# Patient Record
Sex: Female | Born: 1990 | Race: White | Hispanic: No | Marital: Single | Smoking: Current every day smoker
Health system: Southern US, Community
[De-identification: ages and names within clinical notes are randomized; demographics above are authoritative.]

## PROBLEM LIST (undated history)

## (undated) DIAGNOSIS — I10 Essential (primary) hypertension: Secondary | ICD-10-CM

## (undated) DIAGNOSIS — E079 Disorder of thyroid, unspecified: Secondary | ICD-10-CM

## (undated) HISTORY — PX: TONSILLECTOMY: SUR1361

---

## 2009-05-16 ENCOUNTER — Emergency Department (HOSPITAL_COMMUNITY): Admission: EM | Admit: 2009-05-16 | Discharge: 2009-05-17 | Payer: Self-pay | Admitting: Emergency Medicine

## 2012-05-21 ENCOUNTER — Encounter (HOSPITAL_COMMUNITY): Payer: Self-pay

## 2012-05-21 ENCOUNTER — Inpatient Hospital Stay (HOSPITAL_COMMUNITY)
Admission: EM | Admit: 2012-05-21 | Discharge: 2012-05-26 | DRG: 645 | Disposition: A | Discharge status: Home / Self Care | Payer: MEDICAID | Attending: Internal Medicine | Admitting: Internal Medicine

## 2012-05-21 DIAGNOSIS — E0590 Thyrotoxicosis, unspecified without thyrotoxic crisis or storm: Secondary | ICD-10-CM | POA: Diagnosis present

## 2012-05-21 DIAGNOSIS — E860 Dehydration: Secondary | ICD-10-CM | POA: Diagnosis present

## 2012-05-21 DIAGNOSIS — D509 Iron deficiency anemia, unspecified: Secondary | ICD-10-CM | POA: Diagnosis present

## 2012-05-21 DIAGNOSIS — I951 Orthostatic hypotension: Secondary | ICD-10-CM | POA: Diagnosis present

## 2012-05-21 DIAGNOSIS — R7402 Elevation of levels of lactic acid dehydrogenase (LDH): Secondary | ICD-10-CM | POA: Diagnosis present

## 2012-05-21 DIAGNOSIS — D638 Anemia in other chronic diseases classified elsewhere: Secondary | ICD-10-CM | POA: Diagnosis present

## 2012-05-21 DIAGNOSIS — R7401 Elevation of levels of liver transaminase levels: Secondary | ICD-10-CM | POA: Diagnosis present

## 2012-05-21 DIAGNOSIS — E0591 Thyrotoxicosis, unspecified with thyrotoxic crisis or storm: Principal | ICD-10-CM | POA: Diagnosis present

## 2012-05-21 DIAGNOSIS — I10 Essential (primary) hypertension: Secondary | ICD-10-CM | POA: Diagnosis present

## 2012-05-21 DIAGNOSIS — R079 Chest pain, unspecified: Secondary | ICD-10-CM | POA: Diagnosis present

## 2012-05-21 DIAGNOSIS — Z9089 Acquired absence of other organs: Secondary | ICD-10-CM

## 2012-05-21 DIAGNOSIS — F411 Generalized anxiety disorder: Secondary | ICD-10-CM | POA: Diagnosis present

## 2012-05-21 DIAGNOSIS — E0500 Thyrotoxicosis with diffuse goiter without thyrotoxic crisis or storm: Secondary | ICD-10-CM | POA: Diagnosis present

## 2012-05-21 DIAGNOSIS — H0520 Unspecified exophthalmos: Secondary | ICD-10-CM

## 2012-05-21 DIAGNOSIS — R000 Tachycardia, unspecified: Secondary | ICD-10-CM | POA: Diagnosis present

## 2012-05-21 DIAGNOSIS — F17200 Nicotine dependence, unspecified, uncomplicated: Secondary | ICD-10-CM | POA: Diagnosis present

## 2012-05-21 HISTORY — DX: Disorder of thyroid, unspecified: E07.9

## 2012-05-21 HISTORY — DX: Essential (primary) hypertension: I10

## 2012-05-21 LAB — CBC WITH DIFFERENTIAL/PLATELET
Eosinophils Relative: 1 % (ref 0–5)
Lymphocytes Relative: 28 % (ref 12–46)
Lymphs Abs: 2.3 10*3/uL (ref 0.7–4.0)
MCV: 76.2 fL — ABNORMAL LOW (ref 78.0–100.0)
Neutrophils Relative %: 64 % (ref 43–77)
Platelets: 265 10*3/uL (ref 150–400)
RBC: 5.37 MIL/uL — ABNORMAL HIGH (ref 3.87–5.11)
WBC: 8.1 10*3/uL (ref 4.0–10.5)

## 2012-05-21 LAB — COMPREHENSIVE METABOLIC PANEL
ALT: 17 U/L (ref 0–35)
Alkaline Phosphatase: 320 U/L — ABNORMAL HIGH (ref 39–117)
CO2: 24 mEq/L (ref 19–32)
GFR calc Af Amer: >90 mL/min (ref >90)
GFR calc non Af Amer: >90 mL/min (ref >90)
Glucose, Bld: 138 mg/dL — ABNORMAL HIGH (ref 70–99)
Potassium: 3.9 mEq/L (ref 3.5–5.1)
Sodium: 138 mEq/L (ref 135–145)
Total Bilirubin: 0.2 mg/dL — ABNORMAL LOW (ref 0.3–1.2)

## 2012-05-21 LAB — URINE MICROSCOPIC-ADD ON

## 2012-05-21 LAB — URINALYSIS, ROUTINE W REFLEX MICROSCOPIC
Bilirubin Urine: NEGATIVE
Ketones, ur: NEGATIVE mg/dL
Nitrite: NEGATIVE
Urobilinogen, UA: 1 mg/dL (ref 0.0–1.0)

## 2012-05-21 LAB — POCT PREGNANCY, URINE: Preg Test, Ur: NEGATIVE

## 2012-05-21 LAB — RAPID URINE DRUG SCREEN, HOSP PERFORMED: Barbiturates: NOT DETECTED

## 2012-05-21 NOTE — ED Notes (Signed)
Pt reports [facility] sent her to ED for medication refill. Pt reports she ran out of her Hyperthyroid and High Blood Pressure medication 2 days ago. Pt looking around triage room, tremors noted, fidgeting w/bp and pulse ox cords

## 2012-05-22 ENCOUNTER — Emergency Department (HOSPITAL_COMMUNITY): Payer: Self-pay

## 2012-05-22 DIAGNOSIS — E0590 Thyrotoxicosis, unspecified without thyrotoxic crisis or storm: Secondary | ICD-10-CM

## 2012-05-22 DIAGNOSIS — R7989 Other specified abnormal findings of blood chemistry: Secondary | ICD-10-CM

## 2012-05-22 DIAGNOSIS — I951 Orthostatic hypotension: Secondary | ICD-10-CM | POA: Diagnosis present

## 2012-05-22 DIAGNOSIS — R079 Chest pain, unspecified: Secondary | ICD-10-CM

## 2012-05-22 DIAGNOSIS — H0520 Unspecified exophthalmos: Secondary | ICD-10-CM | POA: Diagnosis present

## 2012-05-22 DIAGNOSIS — D509 Iron deficiency anemia, unspecified: Secondary | ICD-10-CM | POA: Diagnosis present

## 2012-05-22 DIAGNOSIS — R000 Tachycardia, unspecified: Secondary | ICD-10-CM

## 2012-05-22 DIAGNOSIS — E860 Dehydration: Secondary | ICD-10-CM | POA: Diagnosis present

## 2012-05-22 LAB — GLUCOSE, CAPILLARY

## 2012-05-22 LAB — GAMMA GT: GGT: 37 U/L (ref 7–51)

## 2012-05-22 LAB — TROPONIN I: Troponin I: <0.3 ng/mL (ref <0.30)

## 2012-05-22 LAB — CBC
Hemoglobin: 12.2 g/dL (ref 12.0–15.0)
MCHC: 33.1 g/dL (ref 30.0–36.0)
RDW: 13.7 % (ref 11.5–15.5)

## 2012-05-22 LAB — BASIC METABOLIC PANEL
GFR calc Af Amer: >90 mL/min (ref >90)
GFR calc non Af Amer: >90 mL/min (ref >90)
Potassium: 3.6 mEq/L (ref 3.5–5.1)
Sodium: 139 mEq/L (ref 135–145)

## 2012-05-22 LAB — MRSA PCR SCREENING: MRSA by PCR: NEGATIVE

## 2012-05-22 LAB — HEPATIC FUNCTION PANEL: Total Protein: 6.5 g/dL (ref 6.0–8.3)

## 2012-05-22 MED ORDER — METHIMAZOLE 10 MG PO TABS
10.0000 mg | ORAL_TABLET | Freq: Every day | ORAL | Status: DC
  Filled 2012-05-22: qty 1

## 2012-05-22 MED ORDER — HEPARIN SODIUM (PORCINE) 5000 UNIT/ML IJ SOLN
5000.0000 [IU] | Freq: Three times a day (TID) | INTRAMUSCULAR | Status: DC
  Administered 2012-05-22 – 2012-05-26 (×13): 5000 [IU] via SUBCUTANEOUS
  Filled 2012-05-22 (×16): qty 1

## 2012-05-22 MED ORDER — ATENOLOL 25 MG PO TABS: 25.0000 mg | ORAL_TABLET | Freq: Every day | ORAL | Status: DC

## 2012-05-22 MED ORDER — DOCUSATE SODIUM 100 MG PO CAPS
100.0000 mg | ORAL_CAPSULE | Freq: Two times a day (BID) | ORAL | Status: DC
Start: 2012-05-22 — End: 2012-05-26
  Administered 2012-05-24 – 2012-05-25 (×3): 100 mg via ORAL
  Filled 2012-05-22 (×10): qty 1

## 2012-05-22 MED ORDER — METHIMAZOLE 10 MG PO TABS: 10.0000 mg | ORAL_TABLET | Freq: Every day | ORAL | Status: DC

## 2012-05-22 MED ORDER — SODIUM CHLORIDE 0.9 % IV BOLUS (SEPSIS)
1000.0000 mL | Freq: Once | INTRAVENOUS | Status: AC
  Administered 2012-05-22: 1000 mL via INTRAVENOUS

## 2012-05-22 MED ORDER — ONDANSETRON HCL 4 MG/2ML IJ SOLN: 4.0000 mg | Freq: Four times a day (QID) | INTRAMUSCULAR | Status: DC | PRN

## 2012-05-22 MED ORDER — ATENOLOL 50 MG PO TABS: 50.0000 mg | ORAL_TABLET | Freq: Every day | ORAL | Status: DC

## 2012-05-22 MED ORDER — SENNA 8.6 MG PO TABS
1.0000 | ORAL_TABLET | Freq: Two times a day (BID) | ORAL | Status: DC
  Administered 2012-05-24 – 2012-05-25 (×3): 8.6 mg via ORAL
  Filled 2012-05-22 (×10): qty 1

## 2012-05-22 MED ORDER — METOPROLOL TARTRATE 1 MG/ML IV SOLN
5.0000 mg | Freq: Four times a day (QID) | INTRAVENOUS | Status: DC
  Administered 2012-05-22 – 2012-05-23 (×3): 5 mg via INTRAVENOUS
  Filled 2012-05-22 (×6): qty 5

## 2012-05-22 MED ORDER — ATENOLOL 25 MG PO TABS
50.0000 mg | ORAL_TABLET | ORAL | Status: AC
  Administered 2012-05-22: 50 mg via ORAL
  Filled 2012-05-22: qty 2

## 2012-05-22 MED ORDER — ASPIRIN 81 MG PO CHEW: 162.0000 mg | CHEWABLE_TABLET | ORAL | Status: DC

## 2012-05-22 MED ORDER — ALUM & MAG HYDROXIDE-SIMETH 200-200-20 MG/5ML PO SUSP
30.0000 mL | Freq: Four times a day (QID) | ORAL | Status: DC | PRN
  Administered 2012-05-23: 30 mL via ORAL
  Filled 2012-05-22 (×2): qty 30

## 2012-05-22 MED ORDER — ACETAMINOPHEN 325 MG PO TABS
650.0000 mg | ORAL_TABLET | Freq: Once | ORAL | Status: AC
  Administered 2012-05-22: 650 mg via ORAL
  Filled 2012-05-22: qty 2

## 2012-05-22 MED ORDER — ONDANSETRON HCL 4 MG PO TABS: 4.0000 mg | ORAL_TABLET | Freq: Four times a day (QID) | ORAL | Status: DC | PRN

## 2012-05-22 MED ORDER — MECLIZINE HCL 12.5 MG PO TABS
12.5000 mg | ORAL_TABLET | Freq: Three times a day (TID) | ORAL | Status: DC | PRN
  Administered 2012-05-22 – 2012-05-23 (×2): 12.5 mg via ORAL
  Filled 2012-05-22 (×2): qty 1

## 2012-05-22 MED ORDER — HYDROCORTISONE SOD SUCCINATE 100 MG IJ SOLR
50.0000 mg | Freq: Three times a day (TID) | INTRAMUSCULAR | Status: DC
  Administered 2012-05-22 – 2012-05-23 (×4): 50 mg via INTRAVENOUS
  Filled 2012-05-22 (×7): qty 1

## 2012-05-22 MED ORDER — SODIUM CHLORIDE 0.9 % IV SOLN
INTRAVENOUS | Status: DC
  Administered 2012-05-22: 1000 mL via INTRAVENOUS
  Administered 2012-05-22: 09:00:00 via INTRAVENOUS

## 2012-05-22 MED ORDER — SODIUM CHLORIDE 0.9 % IJ SOLN: 3.0000 mL | Freq: Two times a day (BID) | INTRAMUSCULAR | Status: DC

## 2012-05-22 MED ORDER — METHIMAZOLE 10 MG PO TABS
30.0000 mg | ORAL_TABLET | Freq: Every day | ORAL | Status: DC
  Administered 2012-05-23: 30 mg via ORAL
  Filled 2012-05-22: qty 3

## 2012-05-22 MED ORDER — PREDNISONE 20 MG PO TABS
40.0000 mg | ORAL_TABLET | ORAL | Status: AC
  Administered 2012-05-22: 40 mg via ORAL
  Filled 2012-05-22: qty 2

## 2012-05-22 MED ORDER — METHIMAZOLE 10 MG PO TABS
20.0000 mg | ORAL_TABLET | ORAL | Status: AC
  Administered 2012-05-22: 20 mg via ORAL
  Filled 2012-05-22: qty 2

## 2012-05-22 NOTE — Progress Notes (Signed)
Report called to receiving unit 2603.  Past medical history and present hospital course reported.  Patient transferred via wheelchair with belongings with patient. All questions answered.  Patient made aware.

## 2012-05-22 NOTE — Progress Notes (Signed)
[facility] Progress Note [place] TEAM 1 - Stepdown/ICU TEAM   Vickie Beard GNF:[id number] DOB: 1990/12/27 DOA: 05/21/2012 PCP: Sheila Oats, MD  Brief narrative: 21 year old female previously diagnosed with hyperthyroidism/Graves' disease 4 years ago while living in [place]. Presented to the emergency department with request to refill her medications. The patient endorsed no insurance, no PCP and has been receiving medication refills for her Tapazole from local emergency departments. Has been hoarding her medications and staggering her dosages to make sure she does not run out. Last ER visit in [place] 6 months previous. Upon physical exam she was found to be tachycardic with heart rate in the 40s and a low-grade temperature of 100.3. She was quite restless anxious and flushed. The emergency department after 2 L of normal saline her tachycardia improved slightly to 110. Patient also reported chest pain for 2 days but no shortness of breath, nausea vomiting, spasms and twitching. She's not had any reported altered mentation or loss of consciousness. Because of risk for requesting to thyroid storm patient was admitted to the step down unit for further monitoring and evaluation.  Assessment/Plan:  Hyperthyroidism - inadequately treated *TSH quite low at 0.009 with a free T4 markedly elevated at 5.47 *Continue hydrocortisone IV  *Continue Tapazole daily but increase dose *Continue supportive care regarding other sequelae of hyperthyroidism *Will eventually need outpatient followup with an endocrinologist who can decide whether patient is appropriate for radioactive iodine treatment/ablation  Tachycardia *Continue beta blockade with atenolol *Unclear how long patient has been persistently tachycardic and may need echocardiogram to rule out tachycardia induced cardiomyopathy especially with suboptimal blood pressures *Currently at rest is not experiencing  tachycardia  Chest pain *Has resolved after adequate treatment of underlying sequela of hyperthyroidism primarily reducing heart rate improving hydration  Orthostasis/ Dehydration *Patient has significant decrease in supine to sitting blood pressures which did improve somewhat with the saline boluses *Likely related to poor cardiac output in the setting of tachycardia  Transaminitis *Likely sequela of poor perfusion related to dehydration and persistent tachycardia current trend is downward and total bilirubin is normal indicating a nonobstructive etiology  Exophthalmos *Very prominent and hopefully will improve if receives consistent treatment for hyperthyroidism. Unfortunately I suspect this has been occurring for greater than 4 years and may not be reversible  Microcytic anemia *Likely sequela of untreated hyperthyroidism and chronic disease *check an anemia panel  DVT prophylaxis: Heparin subcutaneous Code Status: Full Disposition Plan: Remain in step down  Consultants: None  Procedures: None  Antibiotics: None  HPI/Subjective: Patient alert and states feeling much better than prior to admission although still somewhat anxious and jittery. States she finally slept through the night which has not been able to do recently. Denies further chest pain.   Objective: Blood pressure 119/61, pulse 88, temperature 98.7 F (37.1 C), temperature source Oral, resp. rate 16, height 5\' 2"  (1.575 m), weight 72.2 kg (159 lb 2.8 oz), last menstrual period 05/11/2012, SpO2 97.00%.  Intake/Output Summary (Last 24 hours) at 05/22/12 1108 Last data filed at 05/22/12 1100  Gross per 24 hour  Intake   1860 ml  Output      0 ml  Net   1860 ml    Exam: Full clinical exam deferred since patient less than 24 hours since admission  Data Reviewed: Basic Metabolic Panel:  Lab 05/22/12 8469 05/21/12 2145  NA 139 138  K 3.6 3.9  CL 104 102  CO2 24 24  GLUCOSE 102* 138*  BUN 9 10  CREATININE 0.28* 0.33*  CALCIUM 10.5 10.5  MG -- --  PHOS -- --   Liver Function Tests:  Lab 05/22/12 0443 05/21/12 2145  AST 16 18  ALT 15 17  ALKPHOS 278* 320*  BILITOT 0.3 0.2*  PROT 6.5 7.5  ALBUMIN 3.0* 3.5   CBC:  Lab 05/22/12 0443 05/21/12 2145  WBC 7.0 8.1  NEUTROABS -- 5.1  HGB 12.2 14.1  HCT 36.9 40.9  MCV 76.4* 76.2*  PLT 235 265   Cardiac Enzymes:  Lab 05/22/12 0443  CKTOTAL --  CKMB --  CKMBINDEX --  TROPONINI <0.30   CBG:  Lab 05/22/12 0603  GLUCAP 94    Recent Results (from the past 240 hour(s))  MRSA PCR SCREENING     Status: Normal   Collection Time   05/22/12  6:03 AM      Component Value Range Status Comment   MRSA by PCR NEGATIVE  NEGATIVE Final      Studies:  Recent x-ray studies have been reviewed in detail by the Attending Physician  Scheduled Meds:  Reviewed in detail by the Attending Physician   Junious Silk, ANP [facility] Office  [phone] Pager [phone]  On-Call/Text Page:      [email or web address]      password [email or web address]  If 7PM-7AM, please contact night-coverage [email or web address] Password [email or web address] 05/22/2012, 11:08 AM   LOS: 1 day   I have personally examined this patient and reviewed the entire database. I have reviewed the above note, made any necessary editorial changes, and agree with its content.  Lonia Blood, MD [facility]

## 2012-05-22 NOTE — Care Management Note (Unsigned)
    Page 1 of 1   05/26/2012     11:15:46 AM   CARE MANAGEMENT NOTE 05/26/2012  Patient:  Vickie Beard, Vickie Beard   Account Number:  [id number]  Date Initiated:  05/22/2012  Documentation initiated by:  Avie Arenas  Subjective/Objective Assessment:   Tachycardic -     Action/Plan:   Anticipated DC Date:  05/25/2012   Anticipated DC Plan:  HOME/SELF CARE      DC Planning Services  CM consult  Medication Assistance      Choice offered to / List presented to:             Status of service:  In process, will continue to follow Medicare Important Message given?   (If response is "NO", the following Medicare IM given date fields will be blank) Date Medicare IM given:   Date Additional Medicare IM given:    Discharge Disposition:    Per UR Regulation:  Reviewed for med. necessity/level of care/duration of stay  If discussed at Long Length of Stay Meetings, dates discussed:    Comments:  ContactAlveda Reasons Mother [phone]  05/26/12  1113  Julieanna Geraci SIMMONS RN, BSN [phone] PT IS ELIGIBLE FOR [facility] FUND; MD:  PLEASE WRITE SEPARATE SCRIPTS FOR 3 DAYS SUPPLY OF MEDS AT DISCHARGE TO BE SENT TO MAIN PHARMACY. NCM WILL FOLLOW.   05-22-12 9:30am Avie Arenas, RNBSN [phone] Given list for PCP options in [place].  States on a waiting list at a physicians office already and will be able to get in the end of October.  Lives with Mom, states she will assist with medication that she needs on discharge.    Attempted to contact mother but, no answer. No message left.

## 2012-05-22 NOTE — ED Provider Notes (Signed)
History     CSN: [id number]  Arrival date & time 05/21/12  2034   First MD Initiated Contact with Patient 05/22/12 0003      Chief Complaint  Patient presents with  . Medication Refill    (Consider location/radiation/quality/duration/timing/severity/associated sxs/prior treatment) The history is provided by the patient.  sent here from health dept for med refills, PT denies any sig complaints other than she feels like her heart is beating faster than normal. No F/C - noted to have LGF in triage. No N/V/D, always has heat/ cold sensitivity with h/o thyroid disease - is unchanged. No SOB or CP. Denies any recent illness, no meds today, does have a few pills left in both pill bottles.   Past Medical History  Diagnosis Date  . Hypertension   . Thyroid disease     Past Surgical History  Procedure Date  . Tonsillectomy     History reviewed. No pertinent family history.  History  Substance Use Topics  . Smoking status: Current Everyday Smoker  . Smokeless tobacco: Not on file  . Alcohol Use: Yes     occasional     OB History    Grav Para Term Preterm Abortions TAB SAB Ect Mult Living                  Review of Systems  Constitutional: Negative for fever and chills.  HENT: Negative for neck pain and neck stiffness.   Eyes: Negative for pain.  Respiratory: Negative for shortness of breath.   Cardiovascular: Negative for chest pain and leg swelling.  Gastrointestinal: Negative for abdominal pain.  Genitourinary: Negative for dysuria.  Musculoskeletal: Negative for back pain.  Skin: Negative for rash.  Neurological: Negative for headaches.  All other systems reviewed and are negative.    Allergies  Review of patient's allergies indicates no known allergies.  Home Medications   Current Outpatient Rx  Name Route Sig Dispense Refill  . ATENOLOL 50 MG PO TABS Oral Take 25 mg by mouth daily.    Marland Kitchen METHIMAZOLE 5 MG PO TABS Oral Take 10 mg by mouth daily.      BP  132/60  Pulse 117  Temp 99 F (37.2 C) (Oral)  Resp 18  SpO2 99%  LMP 05/11/2012  Physical Exam  Constitutional: She is oriented to person, place, and time. She appears well-developed and well-nourished.  HENT:  Head: Normocephalic and atraumatic.  Eyes: EOM are normal. Pupils are equal, round, and reactive to light.  Neck: Trachea normal. Neck supple. No thyromegaly present.  Cardiovascular: Regular rhythm, S1 normal, S2 normal and normal pulses.     No systolic murmur is present   No diastolic murmur is present  Pulses:      Radial pulses are 2+ on the right side, and 2+ on the left side.       tachycardic  Pulmonary/Chest: Effort normal and breath sounds normal. She has no wheezes. She has no rhonchi. She has no rales. She exhibits no tenderness.  Abdominal: Soft. Normal appearance and bowel sounds are normal. There is no tenderness. There is no CVA tenderness and negative Murphy's sign.  Musculoskeletal: Normal range of motion. She exhibits no edema and no tenderness.       BLE:s Calves nontender, no cords or erythema, negative Homans sign  Neurological: She is alert and oriented to person, place, and time. She has normal strength. No cranial nerve deficit or sensory deficit. GCS eye subscore is 4. GCS verbal subscore is  5. GCS motor subscore is 6.       hyperreflexive  Skin: Skin is warm and dry. No rash noted. She is not diaphoretic.  Psychiatric: Her speech is normal.       Cooperative and appropriate    ED Course  Procedures (including critical care time)  Results for orders placed during the hospital encounter of 05/21/12  CBC WITH DIFFERENTIAL      Component Value Range   WBC 8.1  4.0 - 10.5 K/uL   RBC 5.37 (*) 3.87 - 5.11 MIL/uL   Hemoglobin 14.1  12.0 - 15.0 g/dL   HCT 16.1  09.6 - 04.5 %   MCV 76.2 (*) 78.0 - 100.0 fL   MCH 26.3  26.0 - 34.0 pg   MCHC 34.5  30.0 - 36.0 g/dL   RDW 40.9  81.1 - 91.4 %   Platelets 265  150 - 400 K/uL   Neutrophils Relative 64   43 - 77 %   Neutro Abs 5.1  1.7 - 7.7 K/uL   Lymphocytes Relative 28  12 - 46 %   Lymphs Abs 2.3  0.7 - 4.0 K/uL   Monocytes Relative 8  3 - 12 %   Monocytes Absolute 0.6  0.1 - 1.0 K/uL   Eosinophils Relative 1  0 - 5 %   Eosinophils Absolute 0.0  0.0 - 0.7 K/uL   Basophils Relative 0  0 - 1 %   Basophils Absolute 0.0  0.0 - 0.1 K/uL  COMPREHENSIVE METABOLIC PANEL      Component Value Range   Sodium 138  135 - 145 mEq/L   Potassium 3.9  3.5 - 5.1 mEq/L   Chloride 102  96 - 112 mEq/L   CO2 24  19 - 32 mEq/L   Glucose, Bld 138 (*) 70 - 99 mg/dL   BUN 10  6 - 23 mg/dL   Creatinine, Ser 7.82 (*) 0.50 - 1.10 mg/dL   Calcium 95.6  8.4 - 21.3 mg/dL   Total Protein 7.5  6.0 - 8.3 g/dL   Albumin 3.5  3.5 - 5.2 g/dL   AST 18  0 - 37 U/L   ALT 17  0 - 35 U/L   Alkaline Phosphatase 320 (*) 39 - 117 U/L   Total Bilirubin 0.2 (*) 0.3 - 1.2 mg/dL   GFR calc non Af Amer >90  >90 mL/min   GFR calc Af Amer >90  >90 mL/min  URINALYSIS, ROUTINE W REFLEX MICROSCOPIC      Component Value Range   Color, Urine YELLOW  YELLOW   APPearance CLEAR  CLEAR   Specific Gravity, Urine 1.020  1.005 - 1.030   pH 5.0  5.0 - 8.0   Glucose, UA NEGATIVE  NEGATIVE mg/dL   Hgb urine dipstick TRACE (*) NEGATIVE   Bilirubin Urine NEGATIVE  NEGATIVE   Ketones, ur NEGATIVE  NEGATIVE mg/dL   Protein, ur NEGATIVE  NEGATIVE mg/dL   Urobilinogen, UA 1.0  0.0 - 1.0 mg/dL   Nitrite NEGATIVE  NEGATIVE   Leukocytes, UA NEGATIVE  NEGATIVE  URINE RAPID DRUG SCREEN (HOSP PERFORMED)      Component Value Range   Opiates NONE DETECTED  NONE DETECTED   Cocaine NONE DETECTED  NONE DETECTED   Benzodiazepines NONE DETECTED  NONE DETECTED   Amphetamines NONE DETECTED  NONE DETECTED   Tetrahydrocannabinol NONE DETECTED  NONE DETECTED   Barbiturates NONE DETECTED  NONE DETECTED  POCT PREGNANCY, URINE  Component Value Range   Preg Test, Ur NEGATIVE  NEGATIVE  URINE MICROSCOPIC-ADD ON      Component Value Range   Squamous  Epithelial / LPF FEW (*) RARE   RBC / HPF 0-2  <3 RBC/hpf   Bacteria, UA RARE  RARE  CBC      Component Value Range   WBC 7.0  4.0 - 10.5 K/uL   RBC 4.83  3.87 - 5.11 MIL/uL   Hemoglobin 12.2  12.0 - 15.0 g/dL   HCT 16.1  09.6 - 04.5 %   MCV 76.4 (*) 78.0 - 100.0 fL   MCH 25.3 (*) 26.0 - 34.0 pg   MCHC 33.1  30.0 - 36.0 g/dL   RDW 40.9  81.1 - 91.4 %   Platelets 235  150 - 400 K/uL   Dg Chest 2 View  05/22/2012  *RADIOLOGY REPORT*  Clinical Data: Chest pain, hypertension, tachycardia  CHEST - 2 VIEW  Comparison: None  Findings: Upper-normal size of cardiac silhouette. Mediastinal contours and pulmonary vascularity normal. Lungs clear. Minimal peribronchial thickening. No pleural effusion or pneumothorax. Bones unremarkable.  IMPRESSION: Minimal bronchitic changes.   Original Report Authenticated By: Lollie Marrow, M.D.        Dg Chest 2 View  05/22/2012  *RADIOLOGY REPORT*  Clinical Data: Chest pain, hypertension, tachycardia  CHEST - 2 VIEW  Comparison: None  Findings: Upper-normal size of cardiac silhouette. Mediastinal contours and pulmonary vascularity normal. Lungs clear. Minimal peribronchial thickening. No pleural effusion or pneumothorax. Bones unremarkable.  IMPRESSION: Minimal bronchitic changes.   Original Report Authenticated By: Lollie Marrow, M.D.     Date: 05/22/2012  Rate: 141  Rhythm: sinus tachycardia  QRS Axis: normal  Intervals: normal  ST/T Wave abnormalities: nonspecific ST changes  Conduction Disutrbances:none  Narrative Interpretation:   Old EKG Reviewed: none available  IVFs.  Home meds provided  2 L IVfs, remioans tachycardic. LGF no obvious infectious source. TSH pending.   MED c/s 4:00 AM d/w [facility] who agrees to evaluate in the ED. Plan MED admit.   MDM   VS and nursing notes reviewed. Labs, ECG, IVFs, medications provided, MED admit.         Sunnie Nielsen, MD 05/22/12 [phone]

## 2012-05-22 NOTE — Progress Notes (Signed)
Clinical Social Work Department BRIEF PSYCHOSOCIAL ASSESSMENT 05/22/2012  Patient:  Vickie Beard, Vickie Beard     Account Number:  [id number]     Admit date:  05/21/2012  Clinical Social Worker:  Margaree Mackintosh  Date/Time:  05/22/2012 11:53 AM  Referred by:  Physician  Date Referred:  05/22/2012 Referred for  Other - See comment   Other Referral:   Financial Assistance.   Interview type:  Patient Other interview type:    PSYCHOSOCIAL DATA Living Status:  FAMILY Admitted from facility:   Level of care:   Primary support name:   Primary support relationship to patient:   Degree of support available:   Pt reports she resides with her mother.    CURRENT CONCERNS Current Concerns  Financial Resources   Other Concerns:    SOCIAL WORK ASSESSMENT / PLAN Clinical Social Worker recieved referral inquirng if pt would qualify for Medicaid to assist with medical needs. CSW reviewed chart and met with pt.  CSW introduced self, explained role, and provided support.  Pt stated that she resides with her mother and moved to [place] from [place] 6 months prior.  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  At this time, pt reports she does not have any bank accounts or assets but does Producer, television/film/video.  CSW made referral to [employer].  No other CSW needs identified at this time, CSW to sign off.  Please re consult if needed.   Assessment/plan status:  No Further Intervention Required Other assessment/ plan:   Information/referral to community resources:   Field seismologist.    PATIENT'S/FAMILY'S RESPONSE TO PLAN OF CARE: Pt answered questions when asked, but did not provide additional details.  Pt thanked CSW for intervention.     Angelia Mould, MSW, [place] [phone]

## 2012-05-22 NOTE — ED Notes (Signed)
Pt has been taking Methimazole 5mg  and Atenolol 50mg 

## 2012-05-22 NOTE — H&P (Signed)
[facility]                                                                                    Patient Demographics  Vickie Beard, is a 21 y.o. female  CSN: [id number]  MRN: [id number]  DOB - Mar 08, 1991  Admit Date - 05/21/2012  Outpatient Primary MD for the patient is DEFAULT,PROVIDER, MD   With History of -  Past Medical History  Diagnosis Date  . Hypertension   . Thyroid disease       Past Surgical History  Procedure Date  . Tonsillectomy     in for   Chief Complaint  Patient presents with  . Medication Refill     HPI  Vickie Beard  is a 21 y.o. female, with significant past medical history of hyperthyroidism secondary to Graves' disease, for the last 4 years, patient reports she does not have insurance, as well she does not have PCP,  where she has been getting her medication refills from ED, reports last time she's been in ED department around [place] area before 6 months, patient presents today to refill her medication, as she reports she only has 2 tablets left of her methimazole and atenolol, but reports as she wanted to spare her tablets in case she can get refill she did not take any medications on Thursday last meds she took her on Wednesday, and she presents for medication refill, patient was found to be tachycardic with heart rate in the 140s, with low-grade temperature 100.3, appears to be extremely anxious and flushed, patient was getting 2 L of normal saline, where she still tachycardic in 110's, upon questioning the patient she reports she has been feeling chest comfort for the last 2 days as well, denies any shortness of breath, syncope, confusion, altered mental status, nausea, vomiting, for spasms or twitching.    Review of Systems    In addition to the HPI above,  Had low-grade fever but no chills No Headache, No changes with Vision or hearing, No problems swallowing food or Liquids, Complaints of chest discomfort but denies  Cough or Shortness of Breath, No Abdominal pain, No Nausea or Vommitting, Bowel movements are regular, No Blood in stool or Urine, No dysuria, No new skin rashes or bruises, No new joints pains-aches,  No new weakness, tingling, numbness in any extremity, No recent weight gain or loss, No polyuria, polydypsia or polyphagia, No significant Mental Stressors.  A full 10 point Review of Systems was done, except as stated above, all other Review of Systems were negative.   Social History History  Substance Use Topics  . Smoking status: Current Everyday Smoker  . Smokeless tobacco: Not on file  . Alcohol Use: Yes     occasional     Family History History reviewed. No pertinent family history.   Prior to Admission medications   Medication Sig Start Date End Date Taking? Authorizing Provider  atenolol (TENORMIN) 50 MG tablet Take 25 mg by mouth daily.   Yes Historical Provider, MD  methimazole (TAPAZOLE) 5 MG tablet Take 10 mg by mouth daily.   Yes Historical Provider, MD    No Known Allergies  Physical Exam  Vitals  Blood pressure 101/48, pulse 119, temperature 98.5 F (36.9 C), temperature source Oral, resp. rate 16, last menstrual period 05/11/2012, SpO2 99.00%.   1. General Young anxious female lying in bed in NAD,   2. anxious, Not Suicidal or Homicidal, Awake Alert, Oriented X 3.  3. No F.N deficits, ALL C.Nerves Intact, Strength 5/5 all 4 extremities, Sensation intact all 4 extremities, Plantars down going.  4. has exophthalmos ,Ears  appear Normal, injected Conjunctivae , PERRLA. Moist Oral Mucosa.  5. Supple Neck, No JVD, No cervical lymphadenopathy appriciated, No Carotid Bruits.  6. Symmetrical Chest wall movement, Good air movement bilaterally, CTAB.  7. regular rhythm but tachycardic, No Gallops, Rubs or Murmurs, No Parasternal Heave.  8. Positive Bowel Sounds, Abdomen Soft, Non tender, No organomegaly appriciated,No rebound -guarding or rigidity.  9.   No Cyanosis, Normal Skin Turgor, No Skin Rash or Bruise.  10. Good muscle tone,  joints appear normal , no effusions, Normal ROM. No hyperreflexia  11. No Palpable Lymph Nodes in Neck or Axillae    Data Review  CBC  Lab 05/21/12 2145  WBC 8.1  HGB 14.1  HCT 40.9  PLT 265  MCV 76.2*  MCH 26.3  MCHC 34.5  RDW 13.8  LYMPHSABS 2.3  MONOABS 0.6  EOSABS 0.0  BASOSABS 0.0  BANDABS --   ------------------------------------------------------------------------------------------------------------------  Chemistries   Lab 05/21/12 2145  NA 138  K 3.9  CL 102  CO2 24  GLUCOSE 138*  BUN 10  CREATININE 0.33*  CALCIUM 10.5  MG --  AST 18  ALT 17  ALKPHOS 320*  BILITOT 0.2*   ------------------------------------------------------------------------------------------------------------------ CrCl is unknown because there is no height on file for the current visit. ------------------------------------------------------------------------------------------------------------------ No results found for this basename: TSH,T4TOTAL,FREET3,T3FREE,THYROIDAB in the last 72 hours   Coagulation profile No results found for this basename: INR:5,PROTIME:5 in the last 168 hours ------------------------------------------------------------------------------------------------------------------- No results found for this basename: DDIMER:2 in the last 72 hours -------------------------------------------------------------------------------------------------------------------  Cardiac Enzymes No results found for this basename: CK:3,CKMB:3,TROPONINI:3,MYOGLOBIN:3 in the last 168 hours ------------------------------------------------------------------------------------------------------------------ No components found with this basename: POCBNP:3   ---------------------------------------------------------------------------------------------------------------  Urinalysis    Component Value  Date/Time   COLORURINE YELLOW 05/21/2012 2243   APPEARANCEUR CLEAR 05/21/2012 2243   LABSPEC 1.020 05/21/2012 2243   PHURINE 5.0 05/21/2012 2243   GLUCOSEU NEGATIVE 05/21/2012 2243   HGBUR TRACE* 05/21/2012 2243   BILIRUBINUR NEGATIVE 05/21/2012 2243   KETONESUR NEGATIVE 05/21/2012 2243   PROTEINUR NEGATIVE 05/21/2012 2243   UROBILINOGEN 1.0 05/21/2012 2243   NITRITE NEGATIVE 05/21/2012 2243   LEUKOCYTESUR NEGATIVE 05/21/2012 2243    ----------------------------------------------------------------------------------------------------------------   Imaging results:   Dg Chest 2 View  05/22/2012  *RADIOLOGY REPORT*  Clinical Data: Chest pain, hypertension, tachycardia  CHEST - 2 VIEW  Comparison: None  Findings: Upper-normal size of cardiac silhouette. Mediastinal contours and pulmonary vascularity normal. Lungs clear. Minimal peribronchial thickening. No pleural effusion or pneumothorax. Bones unremarkable.  IMPRESSION: Minimal bronchitic changes.   Original Report Authenticated By: Lollie Marrow, M.D.     My personal review of EKG: Rhythm NSR, Rate  140, no ST or T wave  Assessment & Plan  Principal Problem:  *Hyperthyroidism Active Problems:  Tachycardia  Chest pain  Elevated LFTs    1. Hypothyroidism,/thyrotoxicosis: patient has Graves' disease for 4 years, has poor followup, reports and have had lap checked for one time, get her medication refill from ED departments, didn't take her medication for one day, will check  a TSH, free T4, will give her 20 mg of methimazole 1 dose now, 40 mg of by mouth prednisone one dose now, will give another dose of atenolol 50 mg oral now, we'll continue with IV fluid gestation, and we'll admit patient to step down,, and we'll have morning team consult endocrinology service in a.m.  2. Sinus tachycardia: This is related to her hyperthyroidism, as well do to stopping atenolol probably, will continue with her eustachian, patient will be bolused another 1 L of normal  saline on top of the previous 2 L she received in ED they will continue her 100 cc per hour, will resume her atenolol at a higher dose, will increase meds atenolol from 25-50 mg oral daily, will give her one dose statin.  3. Chest discomfort: This is most likely related to her hyperthyroidism, will check troponins, will be given 162 of aspirin in ED, will resume atenolol.  4. Elevated alkaline phosphatase, unclear what is the patient's baseline, for the time being will continue with methimazole as patient appears to be in a thyrotoxicosis, will obtain GGT level to clarify if her alkaline phosphatase elevation elevation is related to any liver problem.  Will discuss social worker services, to assist patient with her medication and followup as an outpatient. DVT Prophylaxis Heparin   AM Labs Ordered, also please review Full Orders  Family Communication: Admission, patients condition and plan of care including tests being ordered have been discussed with the patient and mother who indicate understanding and agree with the plan and Code Status.  Code Status full code  Disposition Plan: home  Time spent in minutes : 50 min  Condition GUARDED  Randol Kern, Sofija Antwi M.D on 05/22/2012 at 4:58 AM  urs  [facility] Office  [phone]

## 2012-05-22 NOTE — ED Notes (Signed)
Pt st's she was sent here to get a refill on her HTN meds until she could get into a clinic

## 2012-05-22 NOTE — Progress Notes (Signed)
UR Completed.  [phone] Vickie Beard 05/22/2012

## 2012-05-22 NOTE — Progress Notes (Signed)
[id number] Dr Sharon Seller made aware of Sinus brady 40.  Orders remain for SDU.

## 2012-05-23 DIAGNOSIS — H0520 Unspecified exophthalmos: Secondary | ICD-10-CM

## 2012-05-23 DIAGNOSIS — E0591 Thyrotoxicosis, unspecified with thyrotoxic crisis or storm: Secondary | ICD-10-CM | POA: Diagnosis present

## 2012-05-23 LAB — FOLATE: Folate: >20 ng/mL

## 2012-05-23 LAB — IRON AND TIBC
Iron: 80 ug/dL (ref 42–135)
Saturation Ratios: 28 % (ref 20–55)
TIBC: 287 ug/dL (ref 250–470)

## 2012-05-23 LAB — VITAMIN B12: Vitamin B-12: 597 pg/mL (ref 211–911)

## 2012-05-23 LAB — RETICULOCYTES: Retic Ct Pct: 1.6 % (ref 0.4–3.1)

## 2012-05-23 MED ORDER — METHIMAZOLE 5 MG PO TABS
15.0000 mg | ORAL_TABLET | Freq: Two times a day (BID) | ORAL | Status: DC
  Administered 2012-05-23 – 2012-05-26 (×7): 15 mg via ORAL
  Filled 2012-05-23 (×8): qty 1

## 2012-05-23 MED ORDER — PROPRANOLOL HCL 60 MG PO TABS
60.0000 mg | ORAL_TABLET | Freq: Four times a day (QID) | ORAL | Status: DC
  Administered 2012-05-23 – 2012-05-24 (×4): 60 mg via ORAL
  Filled 2012-05-23 (×7): qty 1

## 2012-05-23 MED ORDER — IODINE STRONG (LUGOLS) 5 % PO SOLN
0.2000 mL | Freq: Three times a day (TID) | ORAL | Status: DC
  Administered 2012-05-23 – 2012-05-26 (×8): 0.2 mL via ORAL
  Filled 2012-05-23 (×16): qty 0.2

## 2012-05-23 MED ORDER — HYDROCORTISONE SOD SUCCINATE 100 MG IJ SOLR
100.0000 mg | Freq: Three times a day (TID) | INTRAMUSCULAR | Status: AC
  Administered 2012-05-23 – 2012-05-25 (×7): 100 mg via INTRAVENOUS
  Filled 2012-05-23 (×9): qty 2

## 2012-05-23 MED ORDER — IODINE STRONG (LUGOLS) 5 % PO SOLN
0.2000 mL | Freq: Three times a day (TID) | ORAL | Status: DC
  Filled 2012-05-23 (×2): qty 0.2

## 2012-05-23 NOTE — Progress Notes (Signed)
[facility] Progress Note [place] TEAM 1 - Stepdown/ICU TEAM   Vickie Beard AVW:[id number] DOB: 12-Apr-1991 DOA: 05/21/2012 PCP: Sheila Oats, MD  Brief narrative: 21 year old female previously diagnosed with hyperthyroidism/Graves' disease 4 years ago while living in [place]. Presented to the emergency department with request to refill her medications. The patient endorsed no insurance, no PCP and has been receiving medication refills for her Tapazole from local emergency departments. Has been hoarding her medications and staggering her dosages to make sure she does not run out. Last ER visit in [place] 6 months previous. Upon physical exam she was found to be tachycardic with heart rate in the 40s and a low-grade temperature of 100.3. She was quite restless anxious and flushed. The emergency department after 2 L of normal saline her tachycardia improved slightly to 110. Patient also reported chest pain for 2 days but no shortness of breath, nausea vomiting, spasms and twitching. She's not had any reported altered mentation or loss of consciousness. Because of risk for requesting to thyroid storm patient was admitted to the step down unit for further monitoring and evaluation.  Assessment/Plan:  Hyperthyroidism - inadequately treated *TSH quite low at 0.009 with a free T4 markedly elevated at 5.47 *Continue hydrocortisone IV  *Continue Tapazole but increase dose *Given persistent tachycardia will change to propranolol and add Lugol's solution as patient is on the borderline diagnostically in qualifying for thyroid store *Will need outpatient followup with an endocrinologist who can decide whether patient is appropriate for radioactive iodine treatment/ablation  Tachycardia *Continue beta blockade with the changes noted above *Unclear how long patient has been persistently tachycardic and may need echocardiogram to rule out tachycardia induced cardiomyopathy  especially with suboptimal blood pressures  Chest pain *Has resolved after adequate treatment of underlying sequela of hyperthyroidism primarily reducing heart rate  Orthostasis/ Dehydration *Patient has significant decrease in supine to sitting blood pressures which did improve somewhat with the saline boluses *Likely related to poor cardiac output in the setting of tachycardia  Exophthalmos *Very prominent and hopefully will improve if receives consistent treatment for hyperthyroidism. Unfortunately I suspect this has been occurring for greater than 4 years and may not be reversible  DVT prophylaxis: Heparin subcutaneous Code Status: Full Disposition Plan: Remain in step down  Consultants: None  Procedures: None  Antibiotics: None  HPI/Subjective: The patient reports that she feels much better today.  She denies chest pain shortness of breath fevers chills nausea or vomiting.  Objective: Blood pressure 111/54, pulse 97, temperature 97.7 F (36.5 C), temperature source Axillary, resp. rate 16, height 5\' 2"  (1.575 m), weight 71.1 kg (156 lb 12 oz), last menstrual period 05/11/2012, SpO2 97.00%.  Intake/Output Summary (Last 24 hours) at 05/23/12 1104 Last data filed at 05/23/12 1000  Gross per 24 hour  Intake   1660 ml  Output      0 ml  Net   1660 ml    Exam: General: No acute respiratory distress Lungs: Clear to auscultation bilaterally without wheezes or crackles Cardiovascular: Tachycardic but regular without murmur gallop or rub normal S1 and S2 Abdomen: Nontender, nondistended, soft, bowel sounds positive, no rebound, no ascites, no appreciable mass Extremities: No significant cyanosis, clubbing; trace pretibial edema bilateral lower extremities  Data Reviewed: Basic Metabolic Panel:  Lab 05/22/12 8295 05/21/12 2145  NA 139 138  K 3.6 3.9  CL 104 102  CO2 24 24  GLUCOSE 102* 138*  BUN 9 10  CREATININE 0.28* 0.33*  CALCIUM 10.5 10.5  MG -- --  PHOS --  --   Liver Function Tests:  Lab 05/22/12 0443 05/21/12 2145  AST 16 18  ALT 15 17  ALKPHOS 278* 320*  BILITOT 0.3 0.2*  PROT 6.5 7.5  ALBUMIN 3.0* 3.5   CBC:  Lab 05/22/12 0443 05/21/12 2145  WBC 7.0 8.1  NEUTROABS -- 5.1  HGB 12.2 14.1  HCT 36.9 40.9  MCV 76.4* 76.2*  PLT 235 265   Cardiac Enzymes:  Lab 05/22/12 1824 05/22/12 1427 05/22/12 0443  CKTOTAL -- -- --  CKMB -- -- --  CKMBINDEX -- -- --  TROPONINI <0.30 <0.30 <0.30   CBG:  Lab 05/22/12 0603  GLUCAP 94    Recent Results (from the past 240 hour(s))  MRSA PCR SCREENING     Status: Normal   Collection Time   05/22/12  6:03 AM      Component Value Range Status Comment   MRSA by PCR NEGATIVE  NEGATIVE Final      Studies:  Recent x-ray studies have been reviewed in detail by the Attending Physician  Scheduled Meds:  Reviewed in detail by the Attending Physician   05/23/2012, 11:04 AM   LOS: 2 days   Lonia Blood, MD [facility] Office  [phone] Pager [phone]  On-Call/Text Page:      [email or web address]      password [email or web address]

## 2012-05-24 MED ORDER — PROPRANOLOL HCL 80 MG PO TABS
80.0000 mg | ORAL_TABLET | Freq: Four times a day (QID) | ORAL | Status: DC
  Administered 2012-05-24 – 2012-05-26 (×9): 80 mg via ORAL
  Filled 2012-05-24 (×12): qty 1

## 2012-05-24 NOTE — Progress Notes (Signed)
[facility] Progress Note [place] TEAM 1 - Stepdown/ICU TEAM   Vickie Beard ZOX:[id number] DOB: 03-16-1991 DOA: 05/21/2012 PCP: Sheila Oats, MD  Brief narrative: 21 year old female previously diagnosed with hyperthyroidism/Graves' disease 4 years ago while living in [place]. Presented to the emergency department with request to refill her medications. The patient endorsed no insurance, no PCP and has been receiving medication refills for her Tapazole from local emergency departments. Has been hoarding her medications and staggering her dosages to make sure she does not run out. Last ER visit in [place] 6 months previous. Upon physical exam she was found to be tachycardic with heart rate in the 40s and a low-grade temperature of 100.3. She was quite restless anxious and flushed. The emergency department after 2 L of normal saline her tachycardia improved slightly to 110. Patient also reported chest pain for 2 days but no shortness of breath, nausea vomiting, spasms and twitching. She's not had any reported altered mentation or loss of consciousness. Because of risk for requesting to thyroid storm patient was admitted to the step down unit for further monitoring and evaluation.  Assessment/Plan:  Hyperthyroidism - inadequately treated *TSH quite low at 0.009 with a free T4 markedly elevated at 5.47 *Continue hydrocortisone IV  *Continue Tapazole at increased dose *titrate propranolol upward - cont Lugols *Will need outpatient followup with an endocrinologist who can decide whether patient is appropriate for radioactive iodine treatment/ablation  Tachycardia *Continue beta blockade with the changes noted above  Chest pain *Has resolved after adequate treatment of underlying sequela of hyperthyroidism primarily reducing heart rate  Orthostasis/ Dehydration *Patient has significant decrease in supine to sitting blood pressures which did improve somewhat with the  saline boluses *Likely related to poor cardiac output in the setting of tachycardia  Exophthalmos *Very prominent and hopefully will improve if receives consistent treatment for hyperthyroidism. Unfortunately I suspect this has been occurring for greater than 4 years and may not be reversible  DVT prophylaxis: Heparin subcutaneous Code Status: Full Disposition Plan: Remain in step down  Consultants: None  Procedures: None  Antibiotics: None  HPI/Subjective: The patient reports that she again feels much better today.  She denies chest pain shortness of breath fevers chills nausea or vomiting.  Objective: Blood pressure 131/57, pulse 114, temperature 98.1 F (36.7 C), temperature source Oral, resp. rate 20, height 5\' 2"  (1.575 m), weight 72 kg (158 lb 11.7 oz), last menstrual period 05/11/2012, SpO2 98.00%.  Intake/Output Summary (Last 24 hours) at 05/24/12 1112 Last data filed at 05/24/12 0900  Gross per 24 hour  Intake   2052 ml  Output      0 ml  Net   2052 ml    Exam: General: No acute respiratory distress Lungs: Clear to auscultation bilaterally without wheezes or crackles Cardiovascular: Tachycardic but regular without murmur gallop or rub normal S1 and S2 Abdomen: Nontender, nondistended, soft, bowel sounds positive, no rebound, no ascites, no appreciable mass Extremities: No significant cyanosis, clubbing; trace pretibial edema bilateral lower extremities  Data Reviewed: Basic Metabolic Panel:  Lab 05/22/12 8119 05/21/12 2145  NA 139 138  K 3.6 3.9  CL 104 102  CO2 24 24  GLUCOSE 102* 138*  BUN 9 10  CREATININE 0.28* 0.33*  CALCIUM 10.5 10.5  MG -- --  PHOS -- --   Liver Function Tests:  Lab 05/22/12 0443 05/21/12 2145  AST 16 18  ALT 15 17  ALKPHOS 278* 320*  BILITOT 0.3 0.2*  PROT 6.5 7.5  ALBUMIN 3.0* 3.5   CBC:  Lab 05/22/12 0443 05/21/12 2145  WBC 7.0 8.1  NEUTROABS -- 5.1  HGB 12.2 14.1  HCT 36.9 40.9  MCV 76.4* 76.2*  PLT 235 265     Cardiac Enzymes:  Lab 05/22/12 1824 05/22/12 1427 05/22/12 0443  CKTOTAL -- -- --  CKMB -- -- --  CKMBINDEX -- -- --  TROPONINI <0.30 <0.30 <0.30   CBG:  Lab 05/22/12 0603  GLUCAP 94    Recent Results (from the past 240 hour(s))  MRSA PCR SCREENING     Status: Normal   Collection Time   05/22/12  6:03 AM      Component Value Range Status Comment   MRSA by PCR NEGATIVE  NEGATIVE Final      Studies:  Recent x-ray studies have been reviewed in detail by the Attending Physician  Scheduled Meds:  Reviewed in detail by the Attending Physician   05/24/2012, 11:12 AM   LOS: 3 days   Lonia Blood, MD [facility] Office  [phone] Pager [phone]  On-Call/Text Page:      [email or web address]      password [email or web address]

## 2012-05-25 LAB — COMPREHENSIVE METABOLIC PANEL
ALT: 15 U/L (ref 0–35)
Alkaline Phosphatase: 258 U/L — ABNORMAL HIGH (ref 39–117)
BUN: 8 mg/dL (ref 6–23)
CO2: 27 mEq/L (ref 19–32)
Chloride: 105 mEq/L (ref 96–112)
GFR calc Af Amer: >90 mL/min (ref >90)
GFR calc non Af Amer: >90 mL/min (ref >90)
Glucose, Bld: 109 mg/dL — ABNORMAL HIGH (ref 70–99)
Potassium: 3.9 mEq/L (ref 3.5–5.1)
Sodium: 141 mEq/L (ref 135–145)
Total Bilirubin: 0.3 mg/dL (ref 0.3–1.2)

## 2012-05-25 LAB — T4, FREE: Free T4: 4.59 ng/dL — ABNORMAL HIGH (ref 0.80–1.80)

## 2012-05-25 LAB — GLUCOSE, CAPILLARY

## 2012-05-25 MED ORDER — PREDNISONE 50 MG PO TABS
60.0000 mg | ORAL_TABLET | Freq: Two times a day (BID) | ORAL | Status: DC
  Administered 2012-05-25 – 2012-05-26 (×2): 60 mg via ORAL
  Filled 2012-05-25 (×5): qty 1

## 2012-05-25 NOTE — Progress Notes (Signed)
[facility] Progress Note [place] TEAM 1 - Stepdown/ICU TEAM   Semone Orlov [id number]:[id number] DOB: Mar 22, 1991 DOA: 05/21/2012 PCP: Sheila Oats, MD  Brief narrative: 21 year old female previously diagnosed with hyperthyroidism/Graves' disease 4 years ago while living in [place]. Presented to the emergency department with request to refill her medications. The patient endorsed no insurance, no PCP and has been receiving medication refills for her Tapazole from local emergency departments. Has been hoarding her medications and staggering her dosages to make sure she does not run out. Last ER visit in [place] 6 months previous. Upon physical exam she was found to be tachycardic with heart rate in the 40s and a low-grade temperature of 100.3. She was quite restless anxious and flushed. The emergency department after 2 L of normal saline her tachycardia improved slightly to 110. Patient also reported chest pain for 2 days but no shortness of breath, nausea vomiting, spasms and twitching. She's not had any reported altered mentation or loss of consciousness. Because of risk for requesting to thyroid storm patient was admitted to the step down unit for further monitoring and evaluation.  Assessment/Plan:  Hyperthyroidism - inadequately treated/thyroid storm *TSH quite low at 0.009 with a free T4 markedly elevated at 5.47-Dr. Sharon Seller consulted over the telephone with Dr. Sharl Ma and recommendation at this time to repeat free T4 and T3, as these value should should reflect rapid improvement with ongoing treatment *Transition hydrocortisone IV to twice a day prednisone with plan for slow taper *Continue Tapazole at increased dose *Continue propranolol  + Lugols *Will need outpatient followup with an endocrinologist - Dr. Sharon Seller confirmed that Dr. Sharl Ma has agreed to followup this patient after discharge - the patient will need to contact his office for a formal  appointment  Tachycardia *Continue beta blockade   Chest pain *Has resolved after adequate treatment of underlying sequela of hyperthyroidism primarily reducing heart rate  Orthostasis/ Dehydration *Patient has significant decrease in supine to sitting blood pressures which did improve somewhat with the saline boluses *Likely related to poor cardiac output in the setting of tachycardia  Exophthalmos *Very prominent and hopefully will improve if receives consistent treatment for hyperthyroidism. Unfortunately I suspect this has been occurring for greater than 4 years and may not be reversible  DVT prophylaxis: Heparin subcutaneous Code Status: Full Disposition Plan: Transfer to telemetry but keep on Team 1 in anticipation of discharge 05/26/2012  Consultants: Verbal consultation via telephone with Dr. Kerr/Endocrinology on 05/25/2012  Procedures: None  Antibiotics: None  HPI/Subjective: No complaints today. Very alert and animated and eager to discharge home.  Objective: Blood pressure 101/69, pulse 102, temperature 98.3 F (36.8 C), temperature source Oral, resp. rate 12, height 5\' 2"  (1.575 m), weight 72 kg (158 lb 11.7 oz), last menstrual period 05/11/2012, SpO2 99.00%.  Intake/Output Summary (Last 24 hours) at 05/25/12 1201 Last data filed at 05/25/12 0800  Gross per 24 hour  Intake    640 ml  Output      0 ml  Net    640 ml    Exam: General: No acute respiratory distress, face and neck as well as upper extremities are quite flushed Lungs: Clear to auscultation bilaterally without wheezes or crackles Cardiovascular: Tachycardic but regular without murmur gallop or rub normal S1 and S2 Abdomen: Nontender, nondistended, soft, bowel sounds positive, no rebound, no ascites, no appreciable mass Extremities: No significant cyanosis, clubbing; trace pretibial edema bilateral lower extremities  Data Reviewed: Basic Metabolic Panel:  Lab 05/25/12 5784 05/22/12 0443  05/21/12 2145  NA 141 139 138  K 3.9 3.6 3.9  CL 105 104 102  CO2 27 24 24   GLUCOSE 109* 102* 138*  BUN 8 9 10   CREATININE 0.26* 0.28* 0.33*  CALCIUM 10.3 10.5 10.5  MG -- -- --  PHOS -- -- --   Liver Function Tests:  Lab 05/25/12 0505 05/22/12 0443 05/21/12 2145  AST 13 16 18   ALT 15 15 17   ALKPHOS 258* 278* 320*  BILITOT 0.3 0.3 0.2*  PROT 6.6 6.5 7.5  ALBUMIN 3.2* 3.0* 3.5   CBC:  Lab 05/22/12 0443 05/21/12 2145  WBC 7.0 8.1  NEUTROABS -- 5.1  HGB 12.2 14.1  HCT 36.9 40.9  MCV 76.4* 76.2*  PLT 235 265   Cardiac Enzymes:  Lab 05/22/12 1824 05/22/12 1427 05/22/12 0443  CKTOTAL -- -- --  CKMB -- -- --  CKMBINDEX -- -- --  TROPONINI <0.30 <0.30 <0.30   CBG:  Lab 05/22/12 0603  GLUCAP 94    Recent Results (from the past 240 hour(s))  MRSA PCR SCREENING     Status: Normal   Collection Time   05/22/12  6:03 AM      Component Value Range Status Comment   MRSA by PCR NEGATIVE  NEGATIVE Final      Studies:  Recent x-ray studies have been reviewed in detail by the Attending Physician  Scheduled Meds:  Reviewed in detail by the Attending Physician   05/25/2012, 12:01 PM   LOS: 4 days   Junious Silk, ANP [facility] Office  [phone] Pager [phone]  On-Call/Text Page:      [email or web address]      password [email or web address]  I have personally examined this patient and reviewed the entire database. I have reviewed the above note, made any necessary editorial changes, and agree with its content.  Lonia Blood, MD [facility]

## 2012-05-25 NOTE — Progress Notes (Signed)
Clinical Social Worker met with pt in room. Pt's mother present at bedside; pt consents to CSW speaking openly with mother present.  CSW provided resource information to [facility] to pt.  CSW reviewed [facility].  CSW provided opportunity for pt to address concerns/questions-none noted at this time.   CSW to sign off.     Angelia Mould, MSW, [place] [phone]

## 2012-05-26 DIAGNOSIS — E860 Dehydration: Secondary | ICD-10-CM

## 2012-05-26 MED ORDER — METHIMAZOLE 5 MG PO TABS: 15.0000 mg | ORAL_TABLET | Freq: Two times a day (BID) | ORAL | Status: DC

## 2012-05-26 MED ORDER — PROPRANOLOL HCL 80 MG PO TABS: 80.0000 mg | ORAL_TABLET | Freq: Four times a day (QID) | ORAL | Status: DC

## 2012-05-26 MED ORDER — IODINE STRONG (LUGOLS) 5 % PO SOLN: 0.2000 mL | Freq: Three times a day (TID) | ORAL | Status: AC

## 2012-05-26 MED ORDER — PREDNISONE 20 MG PO TABS: ORAL_TABLET | ORAL | Status: DC

## 2012-05-26 NOTE — Progress Notes (Signed)
Gave pt her discharge education and 3 days supplies of medications from the pharmacy. Discussed mediations with the pt and her mother. Pt verbalized understanding. Pt ready for discharge with her mother.

## 2012-05-26 NOTE — Discharge Summary (Signed)
DISCHARGE SUMMARY  Shade Rivenbark  MR#: [id number]  DOB:Dec 24, 1990  Date of Admission: 05/21/2012 Date of Discharge: 05/26/2012  Attending Physician:Jeffrey Silvestre Gunner, MD  Patient's NUU:VOZDGUY,[id number], MD  Consults:  verbal telephone consultation with Dr. Kerr/endocrinology on 05/25/2012  Pertinent discharge information: Has had poorly controlled hyperthyroidism due to lack of access to care because of financial concerns. Patient plan is to discharge home with mother who will help with medications. Patient also applying for Medicaid if he is eligible. Patient was evaluated by social work as well as case management this admission.  Discharge Diagnoses: Principal Problem:  *Hyperthyroidism- inadequately treated Active Problems:  Tachycardia  Chest pain  Orthostasis  Dehydration  Transaminitis  Exophthalmos  Microcytic anemia  Thyroid storm   Radiology: Dg Chest 2 View  05/22/2012  *RADIOLOGY REPORT*  Clinical Data: Chest pain, hypertension, tachycardia  CHEST - 2 VIEW  Comparison: None  Findings: Upper-normal size of cardiac silhouette. Mediastinal contours and pulmonary vascularity normal. Lungs clear. Minimal peribronchial thickening. No pleural effusion or pneumothorax. Bones unremarkable.  IMPRESSION: Minimal bronchitic changes.   Original Report Authenticated By: Lollie Marrow, M.D.     Laboratory: Results for orders placed during the hospital encounter of 05/21/12 (from the past 48 hour(s))  COMPREHENSIVE METABOLIC PANEL     Status: Abnormal   Collection Time   05/25/12  5:05 AM      Component Value Range Comment   Sodium 141  135 - 145 mEq/L    Potassium 3.9  3.5 - 5.1 mEq/L    Chloride 105  96 - 112 mEq/L    CO2 27  19 - 32 mEq/L    Glucose, Bld 109 (*) 70 - 99 mg/dL    BUN 8  6 - 23 mg/dL    Creatinine, Ser 9.56 (*) 0.50 - 1.10 mg/dL    Calcium 38.7  8.4 - 10.5 mg/dL    Total Protein 6.6  6.0 - 8.3 g/dL    Albumin 3.2 (*) 3.5 - 5.2 g/dL    AST 13  0 - 37 U/L      ALT 15  0 - 35 U/L    Alkaline Phosphatase 258 (*) 39 - 117 U/L    Total Bilirubin 0.3  0.3 - 1.2 mg/dL    GFR calc non Af Amer >90  >90 mL/min    GFR calc Af Amer >90  >90 mL/min   T4, FREE     Status: Abnormal   Collection Time   05/25/12 10:18 AM      Component Value Range Comment   Free T4 4.59 (*) 0.80 - 1.80 ng/dL   T3, FREE     Status: Abnormal   Collection Time   05/25/12 10:18 AM      Component Value Range Comment   T3, Free 10.7 (*) 2.3 - 4.2 pg/mL   GLUCOSE, CAPILLARY     Status: Abnormal   Collection Time   05/25/12  4:13 PM      Component Value Range Comment   Glucose-Capillary 124 (*) 70 - 99 mg/dL    Comment 1 Documented in Chart      Comment 2 Notify RN     GLUCOSE, CAPILLARY     Status: Abnormal   Collection Time   05/25/12  9:10 PM      Component Value Range Comment   Glucose-Capillary 158 (*) 70 - 99 mg/dL      Medication List  As of 05/26/2012 11:15 AM   STOP taking these medications  atenolol 50 MG tablet         TAKE these medications         Iodine Strong (Lugols) 5 % Soln solution   Take 0.2 mLs by mouth 3 (three) times daily.      methimazole 5 MG tablet   Commonly known as: TAPAZOLE   Take 3 tablets (15 mg total) by mouth 2 (two) times daily.      predniSONE 20 MG tablet   Commonly known as: DELTASONE   3 tabs twice daily times one day, then 2 tabs twice daily for one day, then 1 tab twice daily for one day, then 2 tabs daily x1 day, then 1-1/2 tab daily x1 day then 1 tab daily x1 day, then one half tab daily x 3 days then stop. Please note for first day that you received first 3 tabs dosage at hospital before discharge.      propranolol 80 MG tablet   Commonly known as: INDERAL   Take 1 tablet (80 mg total) by mouth 4 (four) times daily.            History present illness: 21 year old female previously diagnosed with hyperthyroidism/Graves' disease 4 years ago while living in [place]. Presented to the emergency  department with request to refill her medications. The patient explained that she had no insurance, no PCP and has been receiving medication refills for her Tapazole from local emergency departments. Has been hoarding her medications and staggering her dosages to make sure she does not run out. Last ER visit was in [place] 6 months ago. Upon physical exam she was found to be tachycardic with heart rate in the 140s and a low-grade temperature of 100.3. She was quite restless anxious and flushed. In the emergency department after 2 L of normal saline her tachycardia improved slightly to 110. Patient also reported chest pain for 2 days but no shortness of breath, nausea vomiting, spasms and twitching. She's not had any reported altered mentation or loss of consciousness. Because of risk for requesting to thyroid storm patient was admitted to the step down unit for further monitoring and evaluation.  Hospital Course: Principal Problem:  *Hyperthyroidism- inadequately treated Active Problems:  Tachycardia  Chest pain  Orthostasis  Dehydration  Transaminitis  Exophthalmos  Microcytic anemia  Thyroid storm  Hyperthyroidism - inadequately treated/thyroid storm Admitted to the step down ICU because of sequela related to tachycardia and low-grade fever and concerns may progress to full blown thyroid storm. Was started on Tapazole daily but this dose was eventually increased to 3 times a day. Prior to admission she was on Tenormin but due to inadequate heart rate control she was switched to propranolol. Because of persistent tachycardia it was felt patient was progressing to thyroid storm therefore, Lugol solution was added as well. In addition, because of concerns of thyroid storm, IV hydrocortisone was initiated and then transitioned to Prednisone.    TSH was checked this admission and was very low at 0.009 with a free T. elevated at 5.47. Repeat T4 was obtained on 05/25/2012 and this remained elevated at  4.59. T3 also repeated on same date and was also markedly elevated at 10.7. 24 hours prior to discharge Dr. Sharon Seller spoke with Dr. Sharl Ma (endocrinologist) over the telephone. He agreed to see the patient as an outpatient but patient will need to call to schedule appointment. In addition he did recommend checking the above laboratory data prior to discharge noting that free T4 and T3 more  appropriate and monitor response to medications and TSH. Also patient will need to have adequately treated hyperthyroidism and consistent medication administration for at least 6 months before an Thyroid ablation can be considered. No thyroid imaging was obtained this admission.  Tachycardia Heart rate much better controlled after changing and uptitrating beta blockers.  Chest pain No evidence of ischemia. Chest pain resolved after adequate treatment of underlying sequela of hyperthyroidism-primarily reduction of heart rate.  Orthostasis/ Dehydration At time of admission patient was noted to be orthostatic and received multiple saline boluses as well as continuous IV infusion of saline. With restoration of blood volume as well as resolution of tachycardia with medical therapy, patient's blood pressure has stabilized - no further orthostasis noted.  Exophthalmos Very prominent and we are hopeful this will improve if patient receives consistent treatment for her hyperthyroidism.  Day of Discharge BP 108/44  Pulse 90  Temp 98.1 F (36.7 C) (Oral)  Resp 16  Ht 5\' 2"  (1.575 m)  Wt 72 kg (158 lb 11.7 oz)  BMI 29.03 kg/m2  SpO2 99%  LMP 05/11/2012  Physical Exam:  General appearance: alert, cooperative, appears stated age and no distress Eyes: negative except for exophthalmos which actually appears to be slightly improved Resp: clear to auscultation bilaterally Cardio: regular rate and rhythm, S1, S2 normal, no murmur, click, rub or gallop, occasional but minimal tachycardia with exertion GI: soft,  non-tender; bowel sounds normal; no masses,  no organomegaly Extremities: extremities normal, atraumatic, no cyanosis or edema Skin: Skin color, texture, turgor normal except for minimal facial and upper neck flushing. No rashes or lesions Neurologic: Grossly normal  Follow-up: Patient is to call Dr.Kerr with endocrinology to arrange outpatient followup-recommendation is to be seen in the next 1-2 weeks  Disposition:  Discharge home with mother   Junious Silk, ANP pager [phone]   I have examined the patient, reviewed the chart and discussed discharge plans with the patient and her mother. I have modified the above note and agree with it.    Calvert Cantor, MD [phone]

## 2012-07-28 ENCOUNTER — Emergency Department (HOSPITAL_COMMUNITY)
Admission: EM | Admit: 2012-07-28 | Discharge: 2012-07-28 | Disposition: A | Discharge status: Home / Self Care | Payer: Self-pay | Attending: Emergency Medicine | Admitting: Emergency Medicine

## 2012-07-28 ENCOUNTER — Emergency Department (HOSPITAL_COMMUNITY): Payer: Self-pay

## 2012-07-28 ENCOUNTER — Encounter (HOSPITAL_COMMUNITY): Payer: Self-pay | Admitting: Emergency Medicine

## 2012-07-28 DIAGNOSIS — R071 Chest pain on breathing: Secondary | ICD-10-CM | POA: Insufficient documentation

## 2012-07-28 DIAGNOSIS — R059 Cough, unspecified: Secondary | ICD-10-CM | POA: Insufficient documentation

## 2012-07-28 DIAGNOSIS — R0789 Other chest pain: Secondary | ICD-10-CM

## 2012-07-28 DIAGNOSIS — R0602 Shortness of breath: Secondary | ICD-10-CM | POA: Insufficient documentation

## 2012-07-28 DIAGNOSIS — Z79899 Other long term (current) drug therapy: Secondary | ICD-10-CM | POA: Insufficient documentation

## 2012-07-28 DIAGNOSIS — R110 Nausea: Secondary | ICD-10-CM | POA: Insufficient documentation

## 2012-07-28 DIAGNOSIS — R05 Cough: Secondary | ICD-10-CM | POA: Insufficient documentation

## 2012-07-28 DIAGNOSIS — F17200 Nicotine dependence, unspecified, uncomplicated: Secondary | ICD-10-CM | POA: Insufficient documentation

## 2012-07-28 DIAGNOSIS — I10 Essential (primary) hypertension: Secondary | ICD-10-CM | POA: Insufficient documentation

## 2012-07-28 DIAGNOSIS — E079 Disorder of thyroid, unspecified: Secondary | ICD-10-CM | POA: Insufficient documentation

## 2012-07-28 LAB — BASIC METABOLIC PANEL
Chloride: 105 mEq/L (ref 96–112)
GFR calc Af Amer: >90 mL/min (ref >90)
GFR calc non Af Amer: >90 mL/min (ref >90)
Glucose, Bld: 127 mg/dL — ABNORMAL HIGH (ref 70–99)
Potassium: 4 mEq/L (ref 3.5–5.1)
Sodium: 139 mEq/L (ref 135–145)

## 2012-07-28 LAB — URINALYSIS, ROUTINE W REFLEX MICROSCOPIC
Hgb urine dipstick: NEGATIVE
Nitrite: NEGATIVE
Specific Gravity, Urine: 1.01 (ref 1.005–1.030)
Urobilinogen, UA: 1 mg/dL (ref 0.0–1.0)

## 2012-07-28 LAB — CBC
Hemoglobin: 13.7 g/dL (ref 12.0–15.0)
MCHC: 33.4 g/dL (ref 30.0–36.0)
WBC: 9 10*3/uL (ref 4.0–10.5)

## 2012-07-28 LAB — POCT I-STAT TROPONIN I: Troponin i, poc: 0 ng/mL (ref 0.00–0.08)

## 2012-07-28 LAB — POCT PREGNANCY, URINE: Preg Test, Ur: NEGATIVE

## 2012-07-28 NOTE — ED Notes (Signed)
Pt c/o left sided CP down left arm x 2 days with SOB; pt sts pain worse with palpation and inspiration; pt sent from PCP

## 2012-07-28 NOTE — ED Provider Notes (Signed)
History     CSN: [id number]  Arrival date & time 07/28/12  1318   First MD Initiated Contact with Patient 07/28/12 1706      Chief Complaint  Patient presents with  . Chest Pain    (Consider location/radiation/quality/duration/timing/severity/associated sxs/prior treatment) HPI Comments: Patient presents with a chief complaint of chest pain.  Pain located left anterior chest and has been present since last evening.  Pain has been constant.  Pain worse with taking a deep breath.  She describes the pain as a pressure.  Pain associated with SOB.  Pain also associated with some nausea, but no vomiting.  She reports that she has never had pain like this before.  She has had an occasional mild dry cough over the past week.  She denies dizziness, lightheadedness, or syncope.  No prior history of DVT or PE.  She is currently not taking any estrogen containing medications.  No surgery or prolonged travel in the past 4 weeks.  No LE swelling, erythema, or pain.  No prior history of cardiac disease.    Patient is a 21 y.o. female presenting with chest pain. The history is provided by the patient.  Chest Pain Primary symptoms include shortness of breath, cough and nausea. Pertinent negatives for primary symptoms include no fever, no palpitations, no abdominal pain, no vomiting and no dizziness.  Pertinent negatives for associated symptoms include no numbness.     Past Medical History  Diagnosis Date  . Hypertension   . Thyroid disease     Past Surgical History  Procedure Date  . Tonsillectomy     History reviewed. No pertinent family history.  History  Substance Use Topics  . Smoking status: Current Every Day Smoker  . Smokeless tobacco: Not on file  . Alcohol Use: Yes     Comment: occasional     OB History    Grav Para Term Preterm Abortions TAB SAB Ect Mult Living                  Review of Systems  Constitutional: Negative for fever and chills.  Respiratory: Positive for  cough and shortness of breath.   Cardiovascular: Positive for chest pain. Negative for palpitations and leg swelling.  Gastrointestinal: Positive for nausea. Negative for vomiting, abdominal pain and diarrhea.  Skin: Negative for rash.  Neurological: Negative for dizziness, syncope, light-headedness and numbness.    Allergies  Review of patient's allergies indicates no known allergies.  Home Medications   Current Outpatient Rx  Name  Route  Sig  Dispense  Refill  . METHIMAZOLE 5 MG PO TABS   Oral   Take 3 tablets (15 mg total) by mouth 2 (two) times daily.   18 tablet   0   . PREDNISONE 20 MG PO TABS      3 tabs twice daily times one day, then 2 tabs twice daily for one day, then 1 tab twice daily for one day, then 2 tabs daily x1 day, then 1-1/2 tab daily x1 day then 1 tab daily x1 day, then one half tab daily x 3 days then stop. Please note for first day that you received first 3 tabs dosage at hospital before discharge.   13 tablet   0   . PROPRANOLOL HCL 80 MG PO TABS   Oral   Take 1 tablet (80 mg total) by mouth 4 (four) times daily.   12 tablet   0     BP 110/53  Pulse 90  Temp 98.8 F (37.1 C) (Oral)  Resp 15  SpO2 100%  LMP 07/14/2012  Physical Exam  Nursing note and vitals reviewed. Constitutional: She appears well-developed and well-nourished. No distress.  HENT:  Head: Normocephalic and atraumatic.  Mouth/Throat: Oropharynx is clear and moist.  Neck: Normal range of motion. Neck supple.  Cardiovascular: Normal rate, regular rhythm, normal heart sounds and intact distal pulses.   No murmur heard. Pulmonary/Chest: Effort normal and breath sounds normal. No respiratory distress. She has no wheezes. She has no rales. She exhibits tenderness. She exhibits no mass, no edema and no deformity.       Tenderness to palpation of the left anterior chest  Abdominal: Soft. Bowel sounds are normal. She exhibits no distension. There is no tenderness.  Musculoskeletal:  Normal range of motion. She exhibits no edema.       No LE edema or erythema  Neurological: She is alert.  Skin: Skin is warm and dry. No rash noted. She is not diaphoretic. No erythema.  Psychiatric: She has a normal mood and affect.    ED Course  Procedures (including critical care time)  Labs Reviewed  CBC - Abnormal; Notable for the following:    RBC 5.34 (*)     MCV 76.8 (*)     MCH 25.7 (*)     All other components within normal limits  BASIC METABOLIC PANEL - Abnormal; Notable for the following:    Glucose, Bld 127 (*)     Creatinine, Ser 0.44 (*)     All other components within normal limits  URINALYSIS, ROUTINE W REFLEX MICROSCOPIC  POCT PREGNANCY, URINE  POCT I-STAT TROPONIN I  D-DIMER, QUANTITATIVE   Dg Chest 2 View  07/28/2012  *RADIOLOGY REPORT*  Clinical Data: Chest pain  CHEST - 2 VIEW  Comparison: May 22, 2012  Findings: Lungs clear.  Heart size and pulmonary vascularity are normal.  No adenopathy.  No bone lesions.  There is minimal thoracolumbar levoscoliosis.  IMPRESSION: Lungs clear.   Original Report Authenticated By: Bretta Bang, M.D.      No diagnosis found.   Date: 07/28/2012  Rate: 89  Rhythm: normal sinus rhythm  QRS Axis: normal  Intervals: normal  ST/T Wave abnormalities: normal  Conduction Disutrbances:none  Narrative Interpretation:   Old EKG Reviewed: none available   MDM  Patient presenting with chest pain that has been constant since last evening.  Her labs are unremarkable. Troponin negative.  No ischemic changes on EKG.  D-dimer WNL.  Negative CXR.  Her vital signs are stable.  Lungs CTAB.  Therefore, CP does not appear to be cardiac or pulmonary in origin.  Chest wall is tender to palpation.  Therefore, pain could be musculoskeletal.  Patient discharged home.  Return precautions discussed with the patient.  She is in agreement with the plan.        Pascal Lux [place], PA-C 07/29/12 0145

## 2012-07-28 NOTE — ED Notes (Signed)
Patient transported to X-ray 

## 2012-07-28 NOTE — ED Notes (Signed)
Pt A.O.x  4. Denies pain. Denies SOB. Denies N/V/D/C. Vitals stable.  Gait stable with no assist when ambulating. Verbalized understanding of need to follow up with PCP. Able to locate warning signs that further treatment is needed in discharge packet. No further questions at this time.

## 2012-07-29 NOTE — ED Provider Notes (Signed)
Medical screening examination/treatment/procedure(s) were performed by non-physician practitioner and as supervising physician I was immediately available for consultation/collaboration.   Pho[place]ia Pirie M Aiyah Scarpelli, DO 07/29/12 1325 

## 2013-03-02 ENCOUNTER — Emergency Department (HOSPITAL_COMMUNITY)
Admission: EM | Admit: 2013-03-02 | Discharge: 2013-03-02 | Disposition: A | Discharge status: Home / Self Care | Payer: Self-pay | Attending: Emergency Medicine | Admitting: Emergency Medicine

## 2013-03-02 ENCOUNTER — Encounter (HOSPITAL_COMMUNITY): Payer: Self-pay | Admitting: Emergency Medicine

## 2013-03-02 ENCOUNTER — Emergency Department (HOSPITAL_COMMUNITY): Payer: Self-pay

## 2013-03-02 DIAGNOSIS — Y9289 Other specified places as the place of occurrence of the external cause: Secondary | ICD-10-CM | POA: Insufficient documentation

## 2013-03-02 DIAGNOSIS — R110 Nausea: Secondary | ICD-10-CM | POA: Insufficient documentation

## 2013-03-02 DIAGNOSIS — Z79899 Other long term (current) drug therapy: Secondary | ICD-10-CM | POA: Insufficient documentation

## 2013-03-02 DIAGNOSIS — S069X9A Unspecified intracranial injury with loss of consciousness of unspecified duration, initial encounter: Secondary | ICD-10-CM

## 2013-03-02 DIAGNOSIS — W1809XA Striking against other object with subsequent fall, initial encounter: Secondary | ICD-10-CM | POA: Insufficient documentation

## 2013-03-02 DIAGNOSIS — W19XXXA Unspecified fall, initial encounter: Secondary | ICD-10-CM

## 2013-03-02 DIAGNOSIS — S060X9A Concussion with loss of consciousness of unspecified duration, initial encounter: Secondary | ICD-10-CM | POA: Insufficient documentation

## 2013-03-02 DIAGNOSIS — W010XXA Fall on same level from slipping, tripping and stumbling without subsequent striking against object, initial encounter: Secondary | ICD-10-CM | POA: Insufficient documentation

## 2013-03-02 DIAGNOSIS — I10 Essential (primary) hypertension: Secondary | ICD-10-CM | POA: Insufficient documentation

## 2013-03-02 DIAGNOSIS — S335XXA Sprain of ligaments of lumbar spine, initial encounter: Secondary | ICD-10-CM | POA: Insufficient documentation

## 2013-03-02 DIAGNOSIS — E079 Disorder of thyroid, unspecified: Secondary | ICD-10-CM | POA: Insufficient documentation

## 2013-03-02 DIAGNOSIS — Y9389 Activity, other specified: Secondary | ICD-10-CM | POA: Insufficient documentation

## 2013-03-02 DIAGNOSIS — F17200 Nicotine dependence, unspecified, uncomplicated: Secondary | ICD-10-CM | POA: Insufficient documentation

## 2013-03-02 DIAGNOSIS — S161XXA Strain of muscle, fascia and tendon at neck level, initial encounter: Secondary | ICD-10-CM

## 2013-03-02 MED ORDER — MORPHINE SULFATE 4 MG/ML IJ SOLN: 4.0000 mg | INTRAMUSCULAR | Status: DC | PRN

## 2013-03-02 MED ORDER — MORPHINE SULFATE 2 MG/ML IJ SOLN
2.0000 mg | INTRAMUSCULAR | Status: AC | PRN
  Administered 2013-03-02 (×2): 2 mg via INTRAVENOUS
  Filled 2013-03-02 (×2): qty 1

## 2013-03-02 MED ORDER — NAPROXEN 250 MG PO TABS: 250.0000 mg | ORAL_TABLET | Freq: Two times a day (BID) | ORAL | Status: AC

## 2013-03-02 MED ORDER — METHOCARBAMOL 500 MG PO TABS: 1000.0000 mg | ORAL_TABLET | Freq: Four times a day (QID) | ORAL | Status: AC | PRN

## 2013-03-02 MED ORDER — HYDROCODONE-ACETAMINOPHEN 5-325 MG PO TABS: ORAL_TABLET | ORAL | Status: AC

## 2013-03-02 NOTE — ED Notes (Signed)
Per EMS: un witnessed fall, manager heard pt hit head, +LOC of few seconds; swelling noted to back left side of head; upper back/neck pain into shoulder blades; pt c/o mild nausea; given 4 mg zofran en route; 20 G in right AC; NSR on monitor; pt states she was walking and tripped over some boxes. VS BP 140/90 manual HR 92 95% RA CBG 96. A&O x 4. Immobilized c-spine.

## 2013-03-02 NOTE — ED Provider Notes (Signed)
History     CSN: [id number]  Arrival date & time 03/02/13  1005   First MD Initiated Contact with Patient 03/02/13 1024      Chief Complaint  Patient presents with  . Fall  . Neck Pain     HPI Pt was seen at 1035.  Per pt, c/o sudden onset and resolution of one episode of slip and fall backwards PTA. Pt states she slipped/tripped over some boxes and fell backwards, hitting her head on the floor. Pt states she "might have blacked out for a few seconds."  Pt c/o pain to the back of her head and neck. Has been associated with nausea.  Denies focal motor weakness, no tingling/numbness in extremities, no prodromal symptoms before fall, no CP/palpitations, no SOB/cough, no abd pain, no vomiting/diarrhea.    Past Medical History  Diagnosis Date  . Hypertension   . Thyroid disease     Past Surgical History  Procedure Laterality Date  . Tonsillectomy        History  Substance Use Topics  . Smoking status: Current Every Day Smoker  . Smokeless tobacco: Not on file  . Alcohol Use: Yes     Comment: occasional       Review of Systems ROS: Statement: All systems negative except as marked or noted in the HPI; Constitutional: Negative for fever and chills. ; ; Eyes: Negative for eye pain, redness and discharge. ; ; ENMT: Negative for ear pain, hoarseness, nasal congestion, sinus pressure and sore throat. ; ; Cardiovascular: Negative for chest pain, palpitations, diaphoresis, dyspnea and peripheral edema. ; ; Respiratory: Negative for cough, wheezing and stridor. ; ; Gastrointestinal: +nausea. Negative for vomiting, diarrhea, abdominal pain, blood in stool, hematemesis, jaundice and rectal bleeding. . ; ; Genitourinary: Negative for dysuria, flank pain and hematuria. ; ; Musculoskeletal: +head injury, neck pain. Negative for back pain. Negative for swelling and trauma.; ; Skin: Negative for pruritus, rash, abrasions, blisters, bruising and skin lesion.; ; Neuro: Negative for headache,  lightheadedness and neck stiffness. Negative for weakness, altered level of consciousness , altered mental status, extremity weakness, paresthesias, involuntary movement, seizure and syncope.       Allergies  Review of patient's allergies indicates no known allergies.  Home Medications   Current Outpatient Rx  Name  Route  Sig  Dispense  Refill  . atenolol (TENORMIN) 100 MG tablet   Oral   Take 100 mg by mouth daily.         . methimazole (TAPAZOLE) 10 MG tablet   Oral   Take 40 mg by mouth 2 (two) times daily.            BP 149/93  Pulse 97  Temp(Src) 98 F (36.7 C) (Oral)  Resp 22  SpO2 100%  LMP 02/23/2013  Physical Exam 1040: Physical examination: Vital signs and O2 SAT: Reviewed; Constitutional: Well developed, Well nourished, Well hydrated, In no acute distress; Head and Face: Normocephalic, Atraumatic; Eyes: EOMI, PERRL, No scleral icterus; ENMT: Mouth and pharynx normal, Left TM normal, Right TM normal, Mucous membranes moist; Neck: Immobilized in C-collar, Trachea midline; Spine: Immobilized on spineboard, No midline CS, TS, LS tenderness. +TTP bilat hypertonic trapezius muscles.; Cardiovascular: Regular rate and rhythm, No murmur, rub, or gallop; Respiratory: Breath sounds clear & equal bilaterally, No rales, rhonchi, wheezes, Normal respiratory effort/excursion; Chest: Nontender, No deformity, Movement normal, No crepitus, No abrasions or ecchymosis.; Abdomen: Soft, Nontender, Nondistended, Normal bowel sounds, No abrasions or ecchymosis.; Genitourinary: No CVA tenderness;; Extremities:  No deformity, Full range of motion major/large joints of bilat UE's and LE's without pain or tenderness to palp, Neurovascularly intact, Pulses normal, No tenderness, No edema, Pelvis stable; Neuro: AA&Ox3, GCS 15.  Major CN grossly intact. Speech clear. No facial droop. Grips equal. Strength 5/5 equal bilat UE's and LE's.  DTR 2/4 equal bilat UE's and LE's.  No gross sensory deficits..;  Skin: Color normal, Warm, Dry; Psych:  Anxious, tearful.    ED Course  Procedures   1045:  Pt arrived to ED with LSB and c-collar in place.  Multiple ED staff at bedside to log roll pt off LSB while maintaining cervical spinal immobilization.  LSB removed, c-collar remains in place.  1240:  No midline CS tenderness, FROM CS without midline tenderness. No NMS changes.  C-collar removed. Pt is calmer now.  States her pain is improved after pain meds and she wants to go home now. Neuro exam continues intact.  Will tx symptomatically at this time. Dx and testing d/w pt.  Questions answered.  Verb understanding, agreeable to d/c home with outpt f/u.     MDM  MDM Reviewed: previous chart, nursing note and vitals Interpretation: CT scan   Ct Head Wo Contrast 03/02/2013   *RADIOLOGY REPORT*  Clinical Data:  Unwitnessed fall.  Head pain, neck pain.  CT HEAD WITHOUT CONTRAST CT CERVICAL SPINE WITHOUT CONTRAST  Technique:  Multidetector CT imaging of the head and cervical spine was performed following the standard protocol without intravenous contrast.  Multiplanar CT image reconstructions of the cervical spine were also generated.  Comparison:   None  CT HEAD  Findings: There is no evidence for acute infarction, intracranial hemorrhage, mass lesion, hydrocephalus, or extra-axial fluid. There is no atrophy or white matter disease.  The calvarium is intact.  Paranasal sinuses and mastoids are clear.  No significant scalp hematoma is evident.  IMPRESSION: Negative exam.  CT CERVICAL SPINE  Findings: There is no visible cervical spine fracture or traumatic subluxation.  There is mild straightening of the normal cervical lordotic curve without prevertebral soft tissue swelling.  No intraspinal hematoma or mass is seen.  The craniocervical junction and cervicothoracic junction are intact.  The lung apices are clear.  The thyroid is enlarged, possible goiter.  Correlate with physical exam and laboratory values.   Sonographic evaluation with tissue sampling may be warranted.  IMPRESSION: No visible cervical spine fracture or traumatic subluxation.  Nonspecific thyromegaly.  Continued surveillance is warranted.   Original Report Authenticated By: Davonna Belling, M.D.   Ct Cervical Spine Wo Contrast 03/02/2013   *RADIOLOGY REPORT*  Clinical Data:  Unwitnessed fall.  Head pain, neck pain.  CT HEAD WITHOUT CONTRAST CT CERVICAL SPINE WITHOUT CONTRAST  Technique:  Multidetector CT imaging of the head and cervical spine was performed following the standard protocol without intravenous contrast.  Multiplanar CT image reconstructions of the cervical spine were also generated.  Comparison:   None  CT HEAD  Findings: There is no evidence for acute infarction, intracranial hemorrhage, mass lesion, hydrocephalus, or extra-axial fluid. There is no atrophy or white matter disease.  The calvarium is intact.  Paranasal sinuses and mastoids are clear.  No significant scalp hematoma is evident.  IMPRESSION: Negative exam.  CT CERVICAL SPINE  Findings: There is no visible cervical spine fracture or traumatic subluxation.  There is mild straightening of the normal cervical lordotic curve without prevertebral soft tissue swelling.  No intraspinal hematoma or mass is seen.  The craniocervical junction and cervicothoracic  junction are intact.  The lung apices are clear.  The thyroid is enlarged, possible goiter.  Correlate with physical exam and laboratory values.  Sonographic evaluation with tissue sampling may be warranted.  IMPRESSION: No visible cervical spine fracture or traumatic subluxation.  Nonspecific thyromegaly.  Continued surveillance is warranted.   Original Report Authenticated By: Davonna Belling, M.D.           Laray Anger, DO 03/03/13 2108

## 2013-03-25 ENCOUNTER — Other Ambulatory Visit: Payer: Self-pay | Admitting: Family Medicine

## 2013-03-25 ENCOUNTER — Encounter: Payer: Self-pay | Admitting: Family Medicine

## 2013-03-25 ENCOUNTER — Ambulatory Visit (INDEPENDENT_AMBULATORY_CARE_PROVIDER_SITE_OTHER): Payer: BC Managed Care – PPO | Admitting: Family Medicine

## 2013-03-25 VITALS — BP 106/76 | HR 81 | Temp 97.8°F | Resp 16 | Ht 62.5 in | Wt 163.8 lb

## 2013-03-25 DIAGNOSIS — F410 Panic disorder [episodic paroxysmal anxiety] without agoraphobia: Secondary | ICD-10-CM

## 2013-03-25 DIAGNOSIS — E0590 Thyrotoxicosis, unspecified without thyrotoxic crisis or storm: Secondary | ICD-10-CM

## 2013-03-25 LAB — T4, FREE: Free T4: 1.6 ng/dL (ref 0.80–1.80)

## 2013-03-25 LAB — TSH: TSH: <0.008 u[IU]/mL — ABNORMAL LOW (ref 0.350–4.500)

## 2013-03-25 MED ORDER — CITALOPRAM HYDROBROMIDE 20 MG PO TABS: 20.0000 mg | ORAL_TABLET | Freq: Every day | ORAL | Status: AC

## 2013-03-25 NOTE — Patient Instructions (Signed)

## 2013-03-25 NOTE — Progress Notes (Signed)
22 year old woman who worked at [employer] at [employer]. She had a fall about a month ago when she got lightheaded and she had good emergency room and get evaluated with a CT scan. She's still having some pain in her.  Patient was diagnosed with hyperthyroidism at age 75. She's been on methimazole and atenolol ever since. She ran out of her insurance and so stopped her atenolol about 2 months ago because it made her feel bad and also stopped her methimazole over week ago. She says these medicines make her feel bad. The atenolol was he needs to control her heart rate.  Patient also had problems with panic attacks. She was recently cited by her employer when she had a meltdown at work. She's sleeping well but does have some generalized anxiety.  Objective: No acute distress  HEENT: Unremarkable Chest: Clear Heart: Regular no murmur Neck: Supple no adenopathy, thyroid not palpable  Assessment: Panic attacks and hyperthyroidism history  Hyperthyroidism - Plan: TSH, T4, Free  Panic disorder - Plan: citalopram (CELEXA) 20 MG tablet  Signed, Elvina Sidle, MD

## 2013-03-26 ENCOUNTER — Other Ambulatory Visit: Payer: Self-pay | Admitting: Family Medicine

## 2013-03-26 DIAGNOSIS — E0590 Thyrotoxicosis, unspecified without thyrotoxic crisis or storm: Secondary | ICD-10-CM

## 2013-03-26 LAB — T3, FREE: T3, Free: 6.1 pg/mL — ABNORMAL HIGH (ref 2.3–4.2)

## 2013-04-01 ENCOUNTER — Ambulatory Visit: Payer: Self-pay | Admitting: Endocrinology

## 2013-04-01 DIAGNOSIS — Z0289 Encounter for other administrative examinations: Secondary | ICD-10-CM

## 2013-05-06 ENCOUNTER — Ambulatory Visit: Payer: BC Managed Care – PPO | Admitting: Family Medicine

## 2013-09-13 IMAGING — CR DG CHEST 2V
2 series · 2 of 2 positions shown · non-contrast
Comparison: None

CLINICAL DATA: Chest pain, hypertension, tachycardia

CHEST - 2 VIEW

[w chest pa]
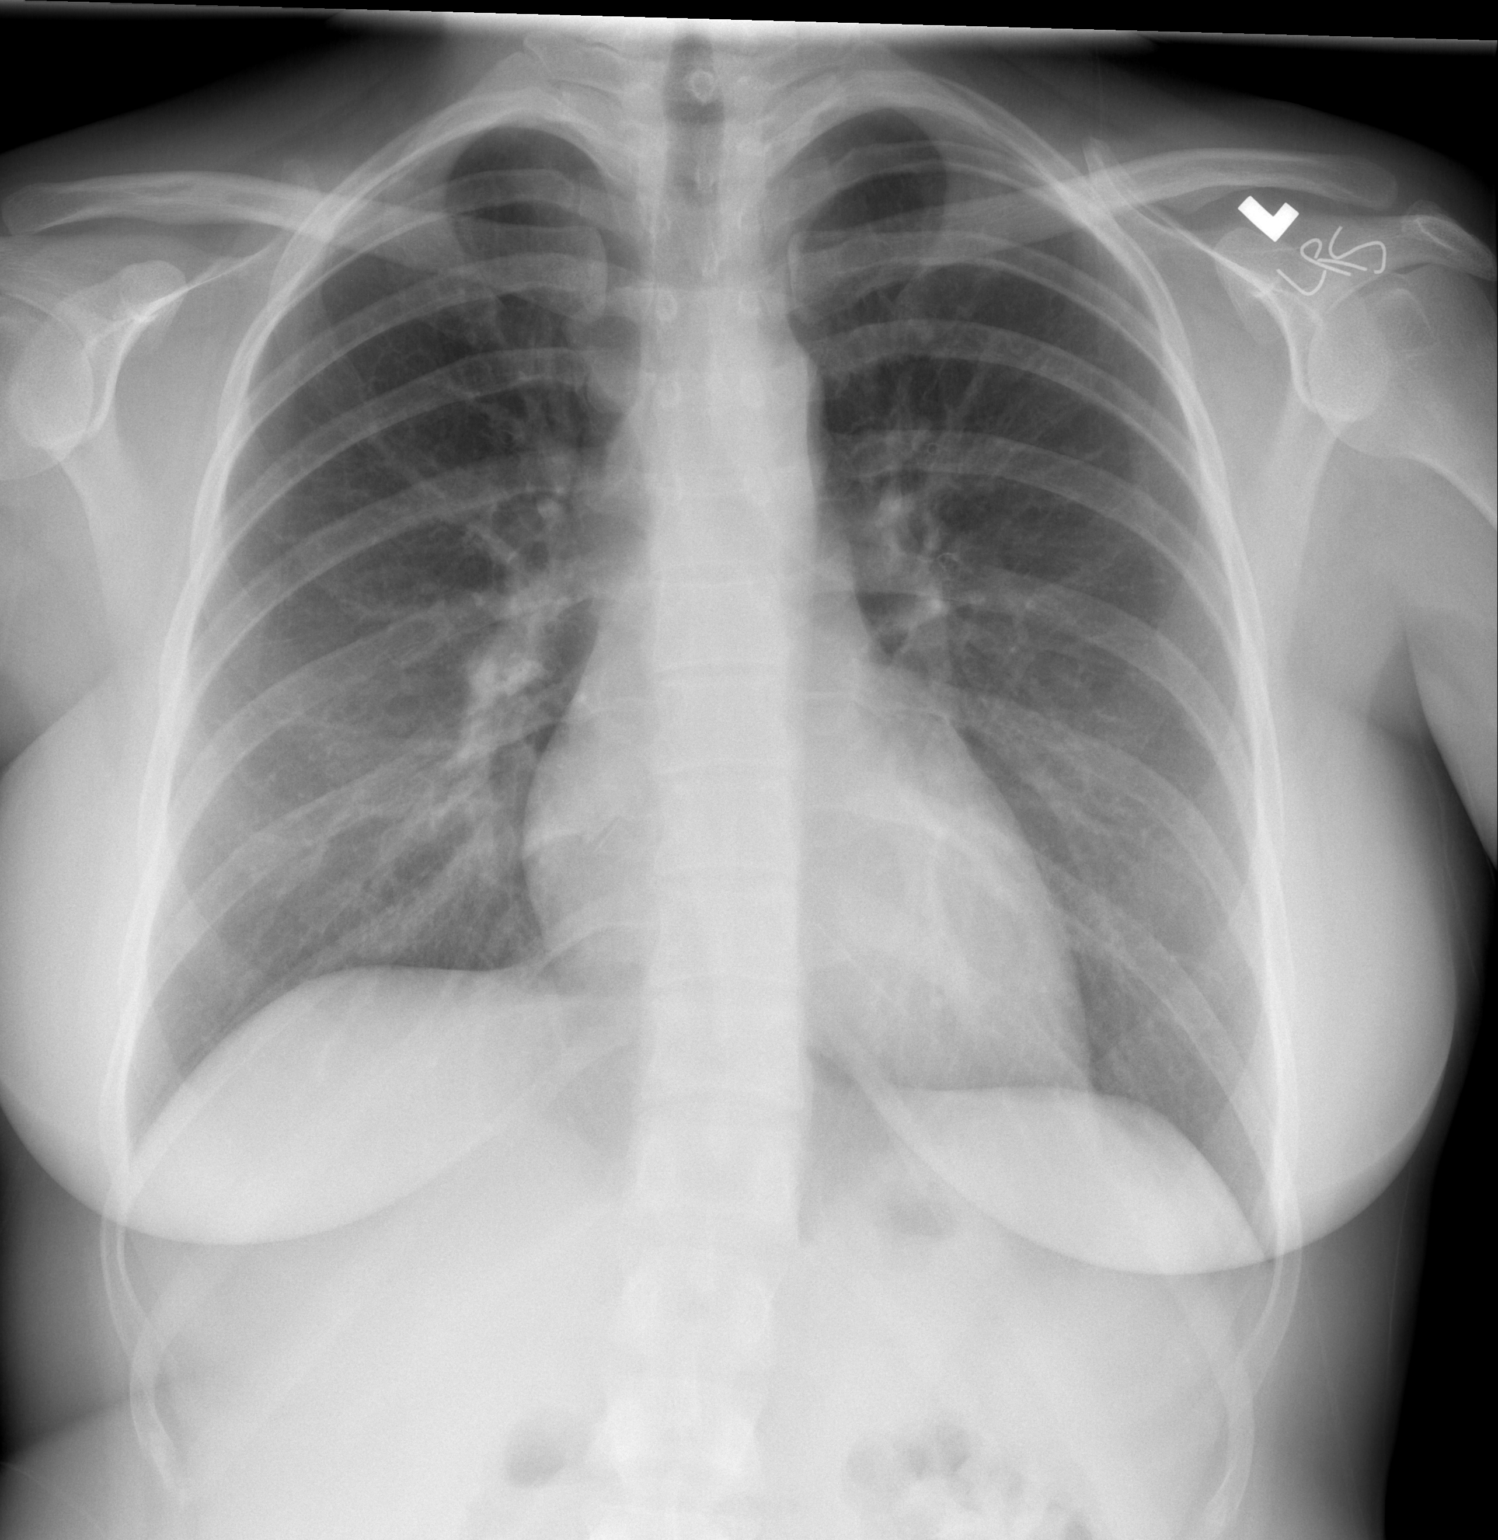

[w chest lat]
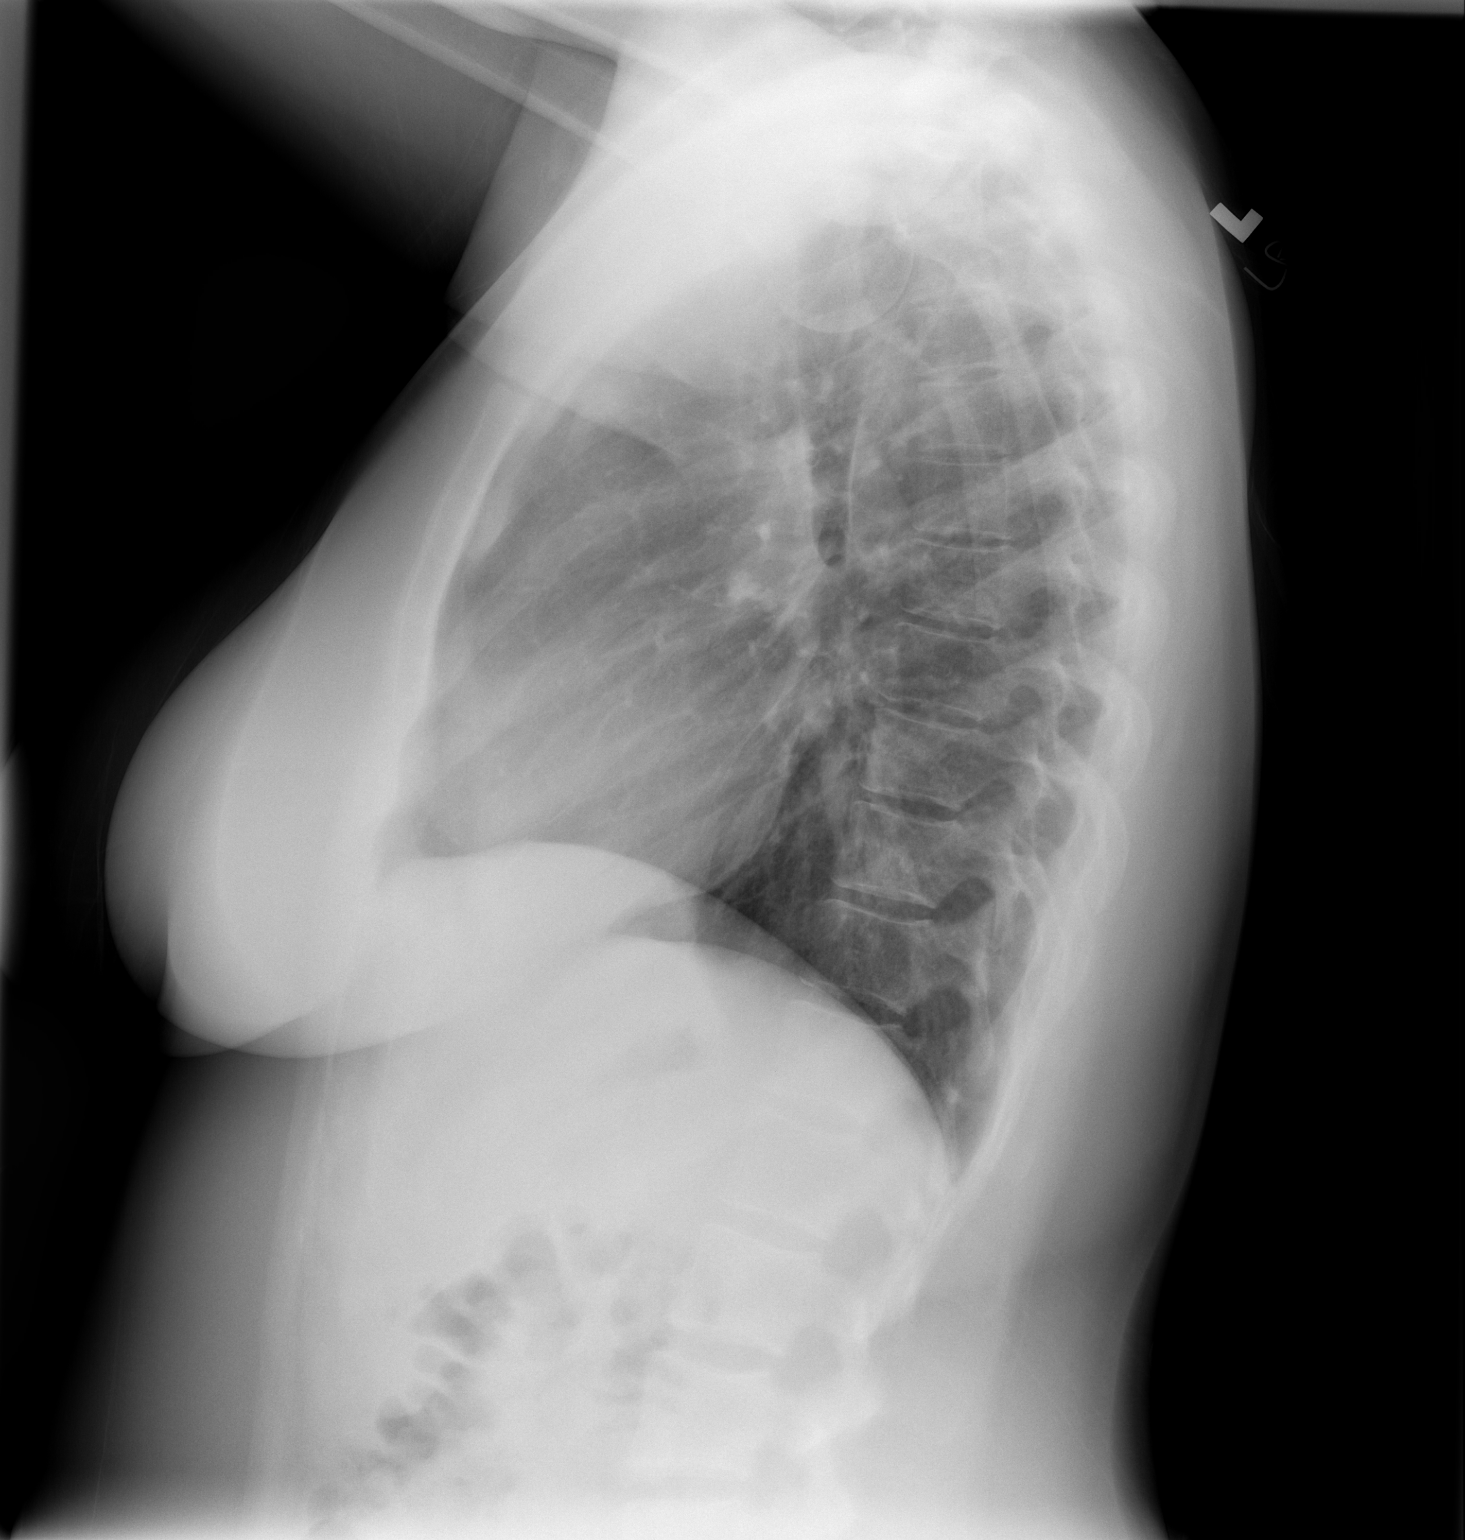

[2 of 2 positions shown; findings below may reference images not displayed]

FINDINGS: Upper-normal size of cardiac silhouette.
Mediastinal contours and pulmonary vascularity normal.
Lungs clear.
Minimal peribronchial thickening.
No pleural effusion or pneumothorax.
Bones unremarkable.
IMPRESSION: Minimal bronchitic changes.

## 2013-11-19 IMAGING — CR DG CHEST 2V
2 series · 2 of 2 positions shown · non-contrast
Comparison: May 22, 2012

CLINICAL DATA: Chest pain

CHEST - 2 VIEW

[w chest pa]
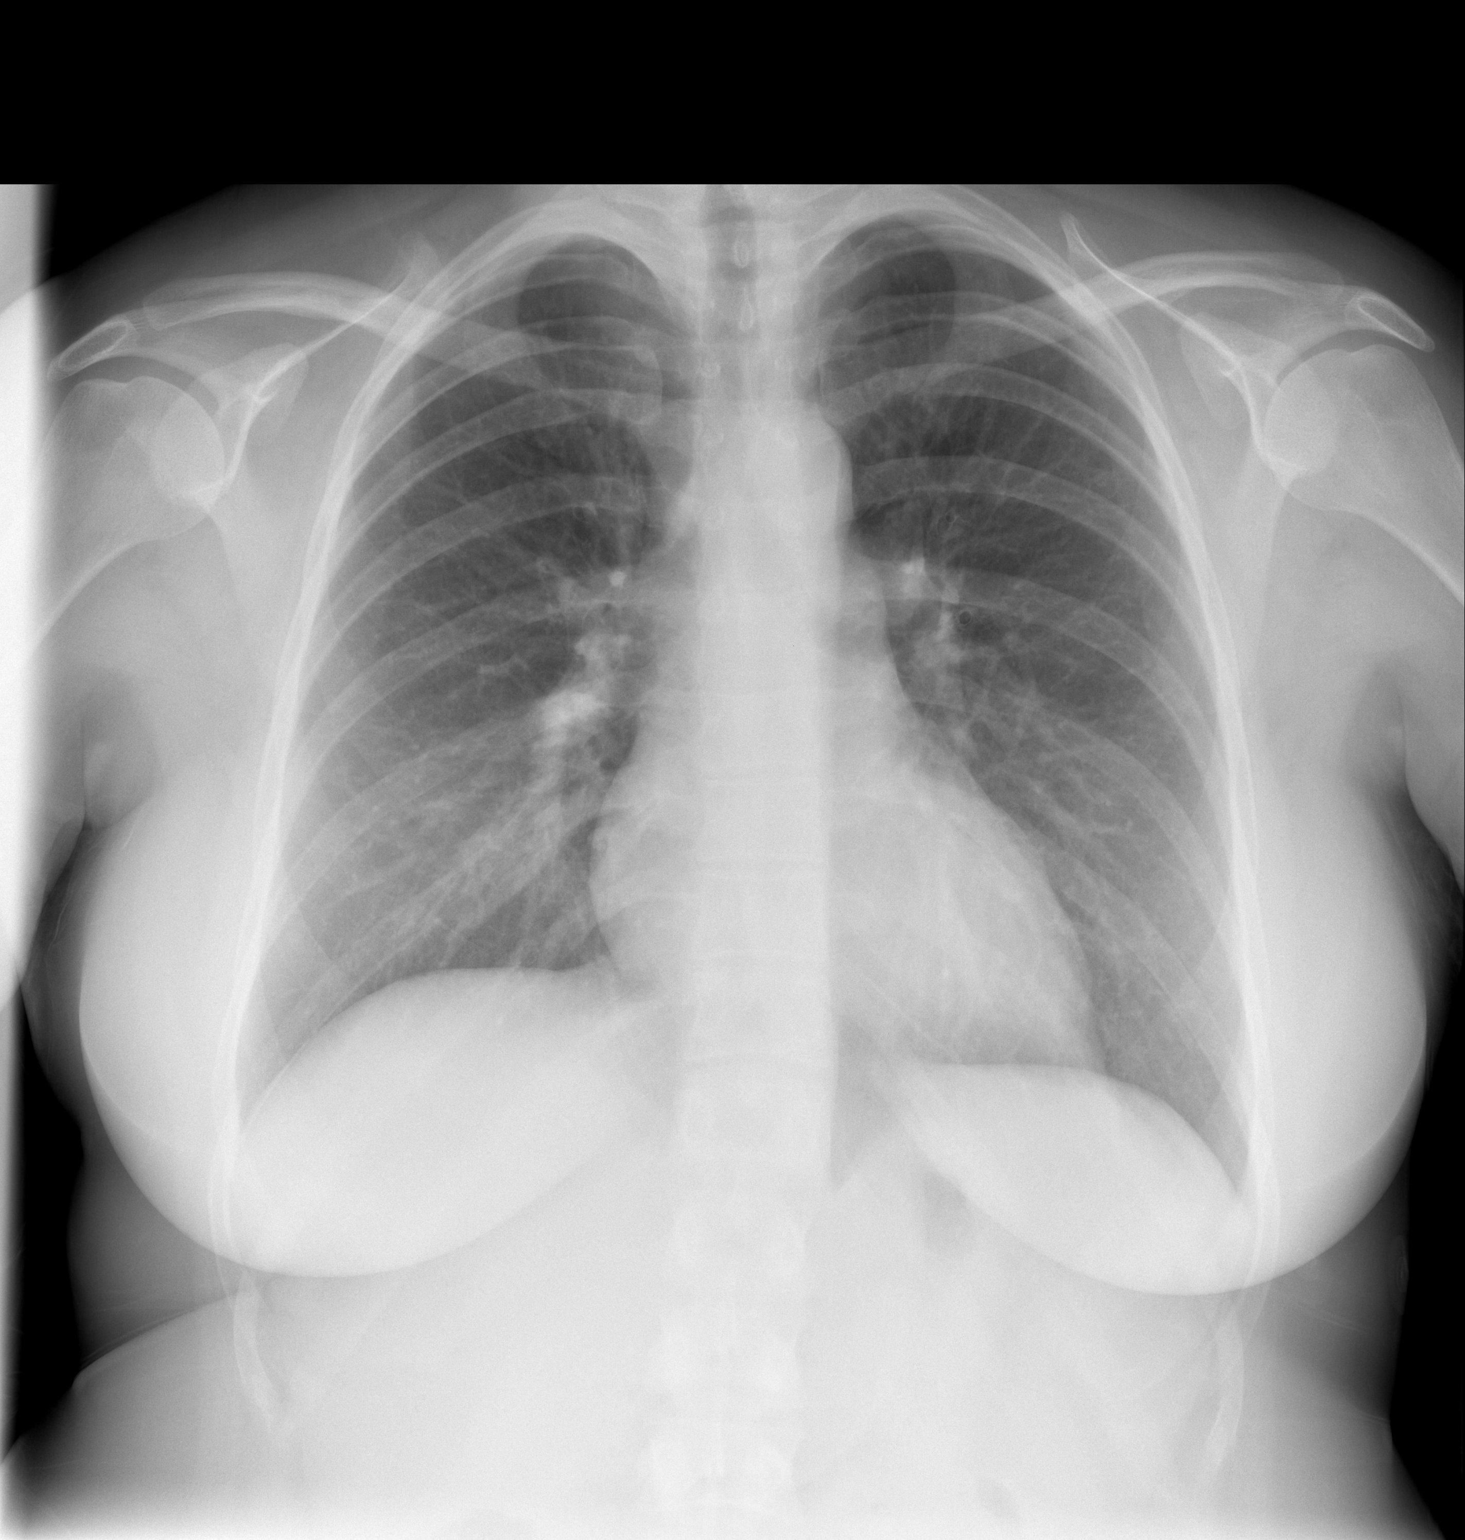

[w chest lat]
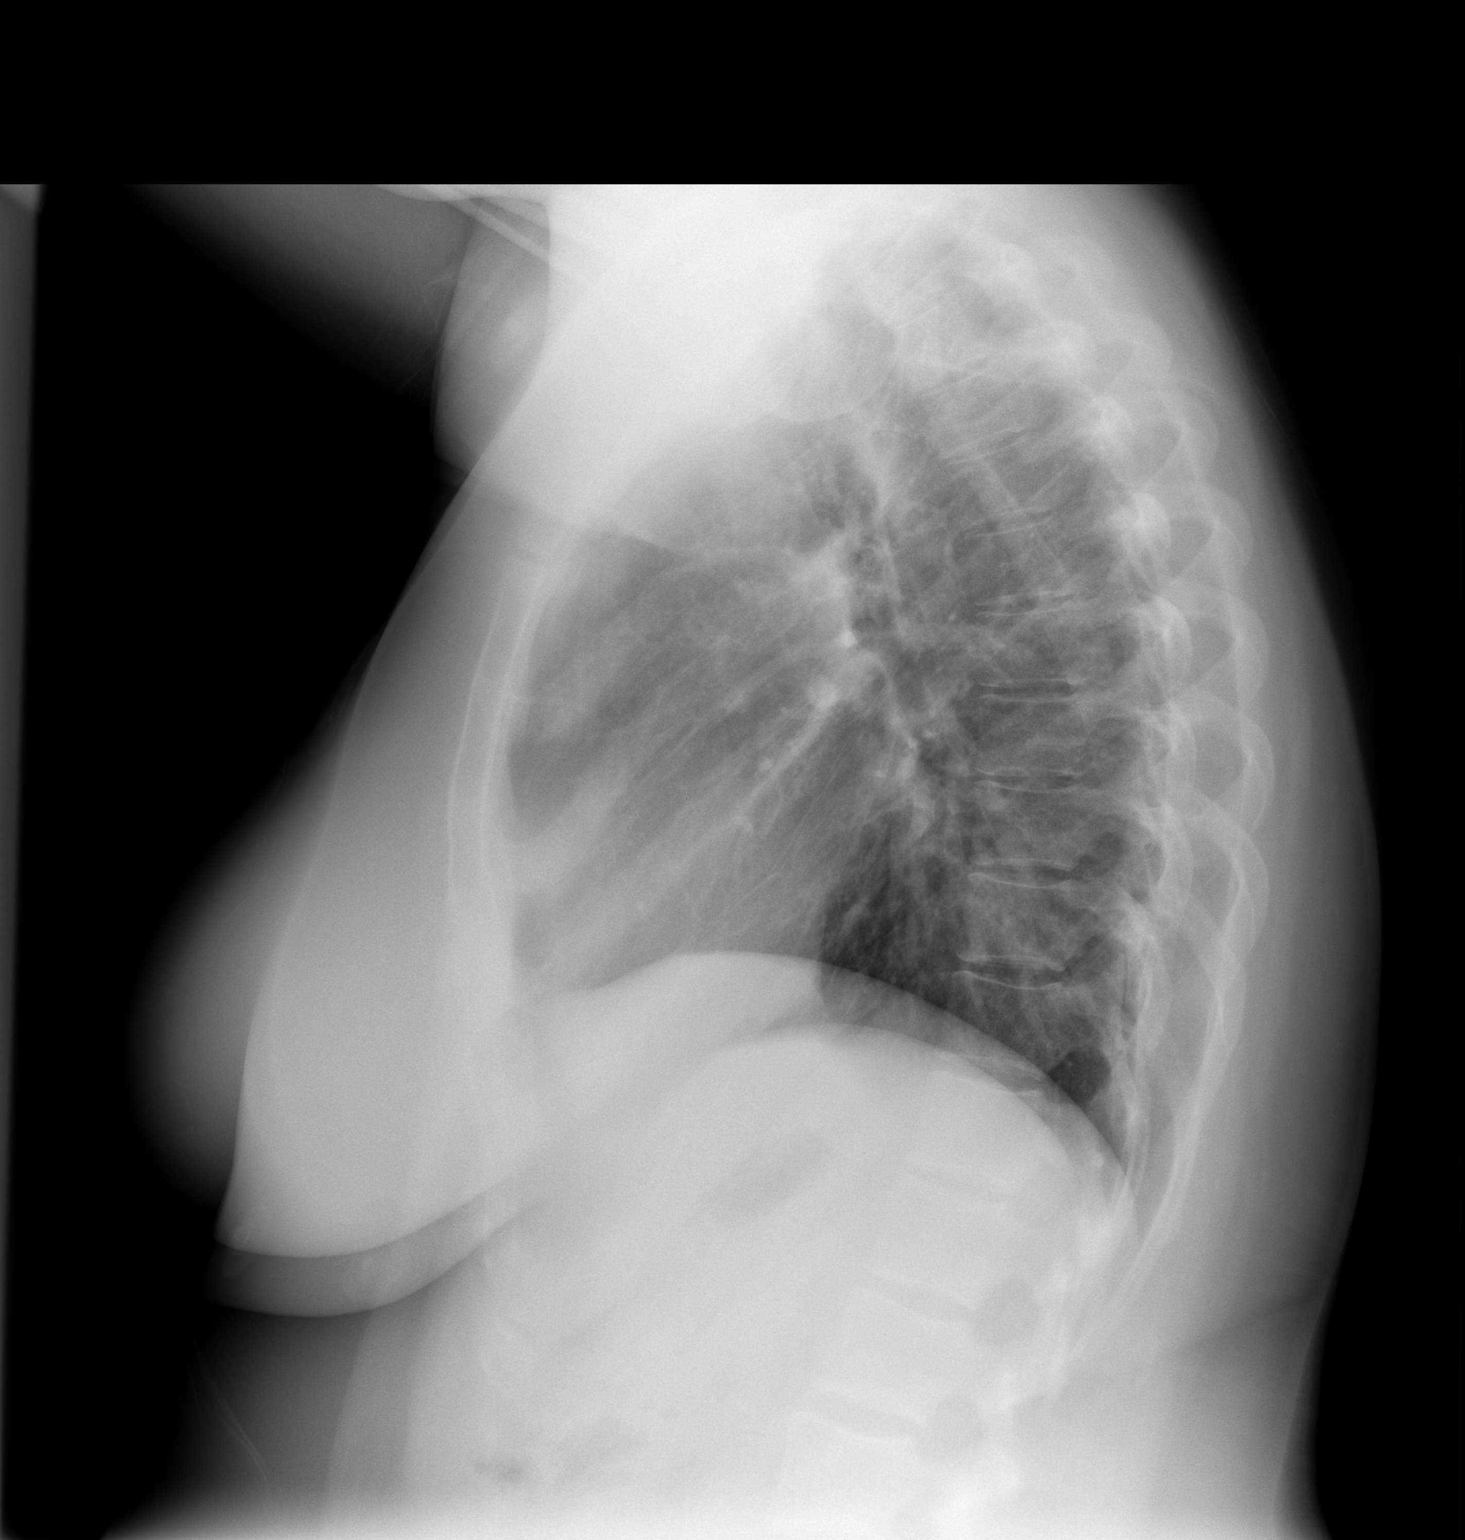

[2 of 2 positions shown; findings below may reference images not displayed]

FINDINGS: Lungs clear.  Heart size and pulmonary vascularity are
normal.  No adenopathy.  No bone lesions.  There is minimal
thoracolumbar levoscoliosis.
IMPRESSION: Lungs clear.

## 2014-04-03 ENCOUNTER — Telehealth: Payer: Self-pay

## 2014-04-03 NOTE — Telephone Encounter (Signed)
Message copied by Johnnette LitterARDWELL, Lynden Flemmer M on Sun Apr 03, 2014  9:17 AM ------      Message from: Elvina SidleLAUENSTEIN, KURT      Created: Thu Mar 31, 2014  5:39 AM       It is time for patient to schedule a visit for her thyroid check unless she is getting her health care elsewhere.      ----- Message -----         From: SYSTEM         Sent: 03/31/2014  12:01 AM           To: Elvina SidleKurt Lauenstein, MD                   ------

## 2014-04-03 NOTE — Telephone Encounter (Signed)
Left message on machine to call back  

## 2014-04-04 NOTE — Telephone Encounter (Signed)
Phone number for pt is incorrect.  Sent letter to call to schedule appt.
